# Patient Record
Sex: Male | Born: 1980 | Race: Black or African American | Hispanic: No | Marital: Single | Smoking: Former smoker
Health system: Southern US, Community
[De-identification: ages and names within clinical notes are randomized; demographics above are authoritative.]

## PROBLEM LIST (undated history)

## (undated) DIAGNOSIS — I10 Essential (primary) hypertension: Secondary | ICD-10-CM

---

## 2006-01-12 ENCOUNTER — Emergency Department (HOSPITAL_COMMUNITY): Admission: EM | Admit: 2006-01-12 | Discharge: 2006-01-12 | Payer: Self-pay | Admitting: Emergency Medicine

## 2006-10-17 ENCOUNTER — Ambulatory Visit: Payer: Self-pay | Admitting: Orthopedic Surgery

## 2012-04-24 ENCOUNTER — Encounter (HOSPITAL_COMMUNITY): Payer: Self-pay | Admitting: Emergency Medicine

## 2012-04-24 ENCOUNTER — Emergency Department (HOSPITAL_COMMUNITY)
Admission: EM | Admit: 2012-04-24 | Discharge: 2012-04-24 | Disposition: A | Discharge status: Home / Self Care | Payer: Self-pay | Attending: Emergency Medicine | Admitting: Emergency Medicine

## 2012-04-24 ENCOUNTER — Emergency Department (HOSPITAL_COMMUNITY): Payer: Self-pay

## 2012-04-24 DIAGNOSIS — Y9364 Activity, baseball: Secondary | ICD-10-CM | POA: Insufficient documentation

## 2012-04-24 DIAGNOSIS — S92909A Unspecified fracture of unspecified foot, initial encounter for closed fracture: Secondary | ICD-10-CM | POA: Insufficient documentation

## 2012-04-24 DIAGNOSIS — S92901A Unspecified fracture of right foot, initial encounter for closed fracture: Secondary | ICD-10-CM

## 2012-04-24 DIAGNOSIS — W219XXA Striking against or struck by unspecified sports equipment, initial encounter: Secondary | ICD-10-CM | POA: Insufficient documentation

## 2012-04-24 DIAGNOSIS — Y9239 Other specified sports and athletic area as the place of occurrence of the external cause: Secondary | ICD-10-CM | POA: Insufficient documentation

## 2012-04-24 MED ORDER — HYDROCODONE-ACETAMINOPHEN 5-325 MG PO TABS: 1.0000 | ORAL_TABLET | ORAL | Status: AC | PRN

## 2012-04-24 MED ORDER — IBUPROFEN 600 MG PO TABS: 600.0000 mg | ORAL_TABLET | Freq: Four times a day (QID) | ORAL | Status: AC | PRN

## 2012-04-24 MED ORDER — HYDROCODONE-ACETAMINOPHEN 5-325 MG PO TABS
1.0000 | ORAL_TABLET | Freq: Once | ORAL | Status: AC
  Administered 2012-04-24: 1 via ORAL
  Filled 2012-04-24: qty 1

## 2012-04-24 NOTE — ED Provider Notes (Signed)
History     CSN: [id number]  Arrival date & time 04/24/12  1008   First MD Initiated Contact with Patient 04/24/12 1017      Chief Complaint  Patient presents with  . Ankle Pain    (Consider location/radiation/quality/duration/timing/severity/associated sxs/prior treatment) HPI Comments: Michael Strong presents for evaluation of pain and swelling to his right foot since yesterday. He was running and dove into 2nd base during a ballgame last night, and possibly hyperextended his right foot behind him.  He has used ice and tried elevation with no improvement in pain.  He is able to ambulate but not comfortably. His pain is constant and sharp without radiation.  Rest improves but does not eliminate the pain.  He denies any other injury.  The history is provided by the patient and the spouse.    History reviewed. No pertinent past medical history.  History reviewed. No pertinent past surgical history.  History reviewed. No pertinent family history.  History  Substance Use Topics  . Smoking status: Former Games developer  . Smokeless tobacco: Not on file  . Alcohol Use: No      Review of Systems  Musculoskeletal: Positive for joint swelling and arthralgias.  Skin: Negative for wound.  Neurological: Negative for weakness and numbness.    Allergies  Review of patient's allergies indicates no known allergies.  Home Medications   Current Outpatient Rx  Name Route Sig Dispense Refill  . HYDROCODONE-ACETAMINOPHEN 5-325 MG PO TABS Oral Take 1 tablet by mouth every 4 (four) hours as needed for pain. 20 tablet 0  . IBUPROFEN 600 MG PO TABS Oral Take 1 tablet (600 mg total) by mouth every 6 (six) hours as needed for pain. 20 tablet 0    BP 131/77  Pulse 77  Temp 98.7 F (37.1 C) (Oral)  Ht 5\' 11"  (1.803 m)  Wt 220 lb (99.791 kg)  BMI 30.68 kg/m2  SpO2 97%  Physical Exam  Constitutional: He is oriented to person, place, and time. He appears well-developed and well-nourished.  No distress.  HENT:  Head: Normocephalic and atraumatic.  Eyes: Conjunctivae are normal.  Neck: Neck supple.  Cardiovascular: Normal rate.   Pulses:      Dorsalis pedis pulses are 2+ on the right side, and 2+ on the left side.       Pulses equal bilaterally  Musculoskeletal: Normal range of motion. He exhibits tenderness.       Right foot: He exhibits tenderness and swelling. He exhibits normal capillary refill and no deformity.       Feet:       No right knee pain,  No proximal fibular tenderness.  Neurological: He is alert and oriented to person, place, and time. He has normal strength. No sensory deficit.       Pt has normal sensation in toes of injured foot,  He can flex his toes with discomfort.    Skin: Skin is warm and dry. No erythema.  Psychiatric: He has a normal mood and affect.    ED Course  Procedures (including critical care time)  Labs Reviewed - No data to display Dg Foot Complete Right  04/24/2012  *RADIOLOGY REPORT*  Clinical Data: Pain after twisting injury.  RIGHT FOOT COMPLETE - 3+ VIEW  Comparison: None.  Findings: Avulsion fracture of the dorsal aspect of the tarsal navicular.  Adjacent soft tissue swelling.  IMPRESSION:  Avulsion fracture of the dorsal aspect of the tarsal navicular. Adjacent soft tissue swelling.   Original  Report Authenticated By: Fuller Canada, M.D.      1. Foot fracture, right    Pt given hydrocodone in ed.   MDM  Suspect severe foot sprain with avulsion fracture.  Pt placed in cam walker and he was given crutches,  Advise non weight bearing, ice, elevation, prescribed hydrocodone and ibuprofen.  Referral to Dr. Romeo Apple for a recheck of his injury this week, pt to call for appt.  Pts xrays reviewed with pt.  He and wife understand plan.  Work note given through end of week.        Burgess Amor, [place] 04/24/12 1810

## 2012-04-24 NOTE — ED Notes (Signed)
Pt stated he was hurt playing baseball. Rt ankle foot swelling noted. Pedal pulses present.

## 2012-04-25 NOTE — ED Provider Notes (Signed)
Medical screening examination/treatment/procedure(s) were performed by non-physician practitioner and as supervising physician I was immediately available for consultation/collaboration.   Maleek Craver B. Dakhari Zuver, MD 04/25/12 0700 

## 2012-04-29 ENCOUNTER — Ambulatory Visit: Payer: Self-pay | Admitting: Orthopedic Surgery

## 2012-04-30 ENCOUNTER — Encounter: Payer: Self-pay | Admitting: Orthopedic Surgery

## 2012-04-30 ENCOUNTER — Ambulatory Visit (INDEPENDENT_AMBULATORY_CARE_PROVIDER_SITE_OTHER): Payer: Self-pay | Admitting: Orthopedic Surgery

## 2012-04-30 VITALS — BP 130/90 | Ht 71.0 in | Wt 220.0 lb

## 2012-04-30 DIAGNOSIS — S92251A Displaced fracture of navicular [scaphoid] of right foot, initial encounter for closed fracture: Secondary | ICD-10-CM | POA: Insufficient documentation

## 2012-04-30 DIAGNOSIS — S92253A Displaced fracture of navicular [scaphoid] of unspecified foot, initial encounter for closed fracture: Secondary | ICD-10-CM

## 2012-04-30 NOTE — Progress Notes (Signed)
Patient ID: Michael Strong, male   DOB: Jun 15, 1981, 31 y.o.   MRN: [id number] Chief Complaint  Patient presents with  . Foot Injury    Right foot fracture, DOI 04/23/12   August 20 date of injury secondary to a fall. Complaints throbbing 10 out of 10 intermittent pain worse when he walks better when he sits down associated with bruising tingling and swelling   review of systems negative  Vital signs are stable as recorded  General appearance is normal  The patient is alert and oriented x3  The patient's mood and affect are normal  Gait assessment: Normal in brace The cardiovascular exam reveals normal pulses and temperature without edema swelling.  The lymphatic system is negative for palpable lymph nodes  The sensory exam is normal.  There are no pathologic reflexes.  Balance is normal.   Right foot is tender over the dorsum but mildly so. Range of motion strength stability normal skin intact.  Dorsal avulsion fracture navicular use Cam Walker or heavy boot. Patient is okay to go back to work  Reexamined in 4 weeks

## 2012-04-30 NOTE — Patient Instructions (Addendum)
Wear CAM WALKER x 1 month

## 2012-06-04 ENCOUNTER — Ambulatory Visit: Payer: Self-pay | Admitting: Orthopedic Surgery

## 2012-06-04 ENCOUNTER — Encounter: Payer: Self-pay | Admitting: Orthopedic Surgery

## 2015-02-21 ENCOUNTER — Emergency Department (HOSPITAL_COMMUNITY)
Admission: EM | Admit: 2015-02-21 | Discharge: 2015-02-21 | Disposition: A | Discharge status: Home / Self Care | Payer: Managed Care, Other (non HMO) | Attending: Emergency Medicine | Admitting: Emergency Medicine

## 2015-02-21 ENCOUNTER — Emergency Department (HOSPITAL_COMMUNITY): Payer: Managed Care, Other (non HMO)

## 2015-02-21 ENCOUNTER — Encounter (HOSPITAL_COMMUNITY): Payer: Self-pay | Admitting: *Deleted

## 2015-02-21 DIAGNOSIS — M779 Enthesopathy, unspecified: Secondary | ICD-10-CM | POA: Diagnosis not present

## 2015-02-21 DIAGNOSIS — Z87891 Personal history of nicotine dependence: Secondary | ICD-10-CM | POA: Insufficient documentation

## 2015-02-21 DIAGNOSIS — M778 Other enthesopathies, not elsewhere classified: Secondary | ICD-10-CM

## 2015-02-21 DIAGNOSIS — M79641 Pain in right hand: Secondary | ICD-10-CM | POA: Diagnosis present

## 2015-02-21 MED ORDER — IBUPROFEN 800 MG PO TABS
800.0000 mg | ORAL_TABLET | Freq: Once | ORAL | Status: AC
  Administered 2015-02-21: 800 mg via ORAL
  Filled 2015-02-21: qty 1

## 2015-02-21 MED ORDER — IBUPROFEN 600 MG PO TABS: 600.0000 mg | ORAL_TABLET | Freq: Four times a day (QID) | ORAL | Status: DC | PRN

## 2015-02-21 NOTE — Discharge Instructions (Signed)

## 2015-02-21 NOTE — ED Provider Notes (Signed)
CSN: [id number]     Arrival date & time 02/21/15  2114 History   First MD Initiated Contact with Patient 02/21/15 2128     Chief Complaint  Patient presents with  . Hand Pain     (Consider location/radiation/quality/duration/timing/severity/associated sxs/prior Treatment) The history is provided by the patient.   Michael Strong is a 34 y.o. right handed male with no significant past medical history presenting with right hand pain which started when he pulled a case off a pallet last night at work, describing this as a frequent repetitive movement for his job.  He has had persistent pain along the length of his hand since which is worsened with flexion of his fingers.  He denies weakness or numbness in the fingers or hand and has had no swelling at the site.  He has had no treatments for his symptoms which are better at rest.     History reviewed. No pertinent past medical history. History reviewed. No pertinent past surgical history. No family history on file. History  Substance Use Topics  . Smoking status: Former Games developer  . Smokeless tobacco: Not on file  . Alcohol Use: No    Review of Systems  Constitutional: Negative for fever.  Musculoskeletal: Positive for joint swelling and arthralgias. Negative for myalgias.  Neurological: Negative for weakness and numbness.      Allergies  Review of patient's allergies indicates no known allergies.  Home Medications   Prior to Admission medications   Medication Sig Start Date End Date Taking? Authorizing Provider  ibuprofen (ADVIL,MOTRIN) 600 MG tablet Take 1 tablet (600 mg total) by mouth every 6 (six) hours as needed. 02/21/15   Burgess Amor, PA-C   BP 121/79 mmHg  Pulse 68  Temp(Src) 98.1 F (36.7 C) (Oral)  Resp 20  Ht 5\' 11"  (1.803 m)  Wt 200 lb (90.719 kg)  BMI 27.91 kg/m2  SpO2 97% Physical Exam  Constitutional: He appears well-developed and well-nourished.  HENT:  Head: Atraumatic.  Neck: Normal range of  motion.  Cardiovascular:  Pulses equal bilaterally  Musculoskeletal: He exhibits tenderness.  ttp along the length of his right 4th volar flexor tendon.  No palpable deformity, no nodules.  Pt displays FROM with flex/ext of all digits, but with increased pain of the 4th.  Less than 2 sec fingertip cap refill. Sensation normal.  Neurological: He is alert. He has normal strength. He displays normal reflexes. No sensory deficit.  Skin: Skin is warm and dry.  Psychiatric: He has a normal mood and affect.    ED Course  Procedures (including critical care time) Labs Review Labs Reviewed - No data to display  Imaging Review Dg Hand Complete Right  02/21/2015   CLINICAL DATA:  RIGHT HAND PAIN, Pt states that he was at work at last, went to pull a case off a pallet and felt a pain in the middle of his right hand, pain has continued through the day, pain is increased with movement and use of right hand,  EXAM: RIGHT HAND - COMPLETE 3+ VIEW  COMPARISON:  None.  FINDINGS: There is no evidence of fracture or dislocation. There is no evidence of arthropathy or other focal bone abnormality. Soft tissues are unremarkable.  IMPRESSION: Negative.   Electronically Signed   By: Annia Belt M.D.   On: 02/21/2015 22:43     EKG Interpretation None      MDM   Final diagnoses:  Tendonitis of right hand    Patients labs and/or  radiological studies were reviewed and considered during the medical decision making and disposition process.  Results were also discussed with patient. Pt placed in finger splint in slight flexion, advised ice, rest, ibuprofen, f/u with ortho if sx persist beyond the next week, referrals given.  The patient appears reasonably screened and/or stabilized for discharge and I doubt any other medical condition or other [facility] requiring further screening, evaluation, or treatment in the ED at this time prior to discharge.     Burgess Amor, PA-C 02/21/15 2311  Mancel Bale, MD 02/22/15  [phone]

## 2015-02-21 NOTE — ED Notes (Signed)
Pt states that he was at work at last, went to pull a case off a pallet and felt a pain in the middle of his right hand, pain has continued through the day, pain is increased with movement and use of right hand,

## 2015-07-01 ENCOUNTER — Emergency Department (HOSPITAL_COMMUNITY)
Admission: EM | Admit: 2015-07-01 | Discharge: 2015-07-01 | Disposition: A | Discharge status: Home / Self Care | Payer: Managed Care, Other (non HMO) | Attending: Emergency Medicine | Admitting: Emergency Medicine

## 2015-07-01 ENCOUNTER — Encounter (HOSPITAL_COMMUNITY): Payer: Self-pay

## 2015-07-01 DIAGNOSIS — M436 Torticollis: Secondary | ICD-10-CM | POA: Diagnosis not present

## 2015-07-01 DIAGNOSIS — M542 Cervicalgia: Secondary | ICD-10-CM | POA: Diagnosis present

## 2015-07-01 DIAGNOSIS — Z87891 Personal history of nicotine dependence: Secondary | ICD-10-CM | POA: Insufficient documentation

## 2015-07-01 MED ORDER — CYCLOBENZAPRINE HCL 10 MG PO TABS: 10.0000 mg | ORAL_TABLET | Freq: Three times a day (TID) | ORAL | Status: DC | PRN

## 2015-07-01 MED ORDER — ACETAMINOPHEN 500 MG PO TABS
1000.0000 mg | ORAL_TABLET | Freq: Once | ORAL | Status: AC
  Administered 2015-07-01: 1000 mg via ORAL
  Filled 2015-07-01: qty 2

## 2015-07-01 MED ORDER — KETOROLAC TROMETHAMINE 60 MG/2ML IM SOLN
60.0000 mg | Freq: Once | INTRAMUSCULAR | Status: AC
  Administered 2015-07-01: 60 mg via INTRAMUSCULAR
  Filled 2015-07-01: qty 2

## 2015-07-01 MED ORDER — IBUPROFEN 600 MG PO TABS: 600.0000 mg | ORAL_TABLET | Freq: Three times a day (TID) | ORAL | Status: DC | PRN

## 2015-07-01 MED ORDER — CYCLOBENZAPRINE HCL 10 MG PO TABS
10.0000 mg | ORAL_TABLET | Freq: Once | ORAL | Status: AC
  Administered 2015-07-01: 10 mg via ORAL
  Filled 2015-07-01: qty 1

## 2015-07-01 NOTE — ED Provider Notes (Signed)
CSN: [id number]     Arrival date & time 07/01/15  0245 History   First MD Initiated Contact with Patient 07/01/15 0252     Chief Complaint  Patient presents with  . Torticollis       HPI Patient awoke this evening with severe pain in his right lateral neck.  He states his pain is worse with movement of his head towards the right.  He keeps his head positioned to the left at this time.  No fevers or chills.  No difficulty breathing or swallowing.  No mouth pain.  No anterior neck pain.  Reports no injury or trauma.  Has not tried any medication prior to arrival.  No other complaints at this time.   History reviewed. No pertinent past medical history. History reviewed. No pertinent past surgical history. No family history on file. Social History  Substance Use Topics  . Smoking status: Former Games developermoker  . Smokeless tobacco: None  . Alcohol Use: No    Review of Systems  All other systems reviewed and are negative.     Allergies  Review of patient's allergies indicates no known allergies.  Home Medications   Prior to Admission medications   Medication Sig Start Date End Date Taking? Authorizing Provider  cyclobenzaprine (FLEXERIL) 10 MG tablet Take 1 tablet (10 mg total) by mouth 3 (three) times daily as needed for muscle spasms. 07/01/15   Azalia BilisKevin Donne Robillard, MD  ibuprofen (ADVIL,MOTRIN) 600 MG tablet Take 1 tablet (600 mg total) by mouth every 8 (eight) hours as needed. 07/01/15   Azalia BilisKevin Charlisha Market, MD   BP 121/79 mmHg  Pulse 77  Temp(Src) 98.3 F (36.8 C) (Oral)  Resp 16  Ht 5\' 10"  (1.778 m)  Wt 220 lb (99.791 kg)  BMI 31.57 kg/m2  SpO2 98% Physical Exam  Constitutional: He is oriented to person, place, and time. He appears well-developed and well-nourished.  HENT:  Head: Normocephalic and atraumatic.  Eyes: EOM are normal.  Neck: Neck supple. No tracheal deviation present. No thyromegaly present.  Mild spasm noted of the right paracervical muscles  Pulmonary/Chest: Effort  normal. No stridor.  Abdominal: He exhibits no distension.  Musculoskeletal: Normal range of motion.  Lymphadenopathy:    He has no cervical adenopathy.  Neurological: He is alert and oriented to person, place, and time.  Psychiatric: He has a normal mood and affect.  Nursing note and vitals reviewed.   ED Course  Procedures (including critical care time) Labs Review Labs Reviewed - No data to display  Imaging Review No results found. I have personally reviewed and evaluated these images and lab results as part of my medical decision-making.   EKG Interpretation None      MDM   Final diagnoses:  Torticollis    Paracervical spasm/torticollis.  Patient treated with anti-inflammatories and muscle relaxants.  Heat applied.  Some improvement while in the emergency department.  Home with muscle relaxants.    Azalia BilisKevin Jonnie Truxillo, MD 07/01/15 [phone]

## 2015-07-01 NOTE — ED Notes (Signed)
Pt awoke Wednesday am with stiffness to his neck and diff turning his head.  Pt denies injury or trauma

## 2015-07-01 NOTE — ED Notes (Signed)
   07/01/15 0326  Musculoskeletal  Musculoskeletal (WDL) X  Range of Motion Other (Comment)  Pt c/o neck pain with minimal movement of the neck without pain. Pt states he woke up like this and denies any injury.

## 2015-07-01 NOTE — Discharge Instructions (Signed)
Acute Torticollis °Torticollis is a condition in which the muscles of the neck tighten (contract) abnormally, causing the neck to twist and the head to move into an unnatural position. Torticollis that develops suddenly is called acute torticollis. If torticollis becomes chronic and is left untreated, the face and neck can become deformed. °CAUSES °This condition may be caused by: °· Sleeping in an awkward position (common). °· Extending or twisting the neck muscles beyond their normal position. °· Infection. °In some cases, the cause may not be known. °SYMPTOMS °Symptoms of this condition include: °· An unnatural position of the head. °· Neck pain. °· A limited ability to move the neck. °· Twisting of the neck to one side. °DIAGNOSIS °This condition is diagnosed with a physical exam. You may also have imaging tests, such as an X-ray, CT scan, or MRI. °TREATMENT °Treatment for this condition involves trying to relax the neck muscles. It may include: °· Medicines or shots. °· Physical therapy. °· Surgery. This may be done in severe cases. °HOME CARE INSTRUCTIONS °· Take medicines only as directed by your health care provider. °· Do stretching exercises and massage your neck as directed by your health care provider. °· Keep all follow-up visits as directed by your health care provider. This is important. °SEEK MEDICAL CARE IF: °· You develop a fever. °SEEK IMMEDIATE MEDICAL CARE IF: °· You develop difficulty breathing. °· You develop noisy breathing (stridor). °· You start drooling. °· You have trouble swallowing or have pain with swallowing. °· You develop numbness or weakness in your hands or feet. °· You have changes in your speech, understanding, or vision. °· Your pain gets worse. °  °This information is not intended to replace advice given to you by your health care provider. Make sure you discuss any questions you have with your health care provider. °  °Document Released: 08/18/2000 Document Revised:  01/05/2015 Document Reviewed: 08/17/2014 °Elsevier Interactive Patient Education ©2016 [employer] ° °

## 2015-10-15 ENCOUNTER — Encounter (HOSPITAL_COMMUNITY): Payer: Self-pay | Admitting: Emergency Medicine

## 2015-10-15 ENCOUNTER — Emergency Department (HOSPITAL_COMMUNITY)
Admission: EM | Admit: 2015-10-15 | Discharge: 2015-10-15 | Disposition: A | Discharge status: Home / Self Care | Payer: Managed Care, Other (non HMO) | Attending: Emergency Medicine | Admitting: Emergency Medicine

## 2015-10-15 ENCOUNTER — Emergency Department (HOSPITAL_COMMUNITY): Payer: Managed Care, Other (non HMO)

## 2015-10-15 DIAGNOSIS — K5900 Constipation, unspecified: Secondary | ICD-10-CM | POA: Insufficient documentation

## 2015-10-15 DIAGNOSIS — K589 Irritable bowel syndrome without diarrhea: Secondary | ICD-10-CM | POA: Insufficient documentation

## 2015-10-15 DIAGNOSIS — Z87891 Personal history of nicotine dependence: Secondary | ICD-10-CM | POA: Insufficient documentation

## 2015-10-15 LAB — CBC WITH DIFFERENTIAL/PLATELET
Basophils Absolute: 0 10*3/uL (ref 0.0–0.1)
Basophils Relative: 0 %
EOS ABS: 0.1 10*3/uL (ref 0.0–0.7)
Eosinophils Relative: 2 %
HEMATOCRIT: 43.3 % (ref 39.0–52.0)
HEMOGLOBIN: 14.7 g/dL (ref 13.0–17.0)
LYMPHS ABS: 1.7 10*3/uL (ref 0.7–4.0)
LYMPHS PCT: 35 %
MCH: 31 pg (ref 26.0–34.0)
MCHC: 33.9 g/dL (ref 30.0–36.0)
MCV: 91.4 fL (ref 78.0–100.0)
MONOS PCT: 12 %
Monocytes Absolute: 0.6 10*3/uL (ref 0.1–1.0)
NEUTROS ABS: 2.5 10*3/uL (ref 1.7–7.7)
NEUTROS PCT: 51 %
PLATELETS: 205 10*3/uL (ref 150–400)
RBC: 4.74 MIL/uL (ref 4.22–5.81)
RDW: 13.4 % (ref 11.5–15.5)
WBC: 4.8 10*3/uL (ref 4.0–10.5)

## 2015-10-15 LAB — BASIC METABOLIC PANEL
ANION GAP: 8 (ref 5–15)
BUN: 17 mg/dL (ref 6–20)
CHLORIDE: 102 mmol/L (ref 101–111)
CO2: 29 mmol/L (ref 22–32)
Calcium: 9.4 mg/dL (ref 8.9–10.3)
Creatinine, Ser: 1.14 mg/dL (ref 0.61–1.24)
Glucose, Bld: 114 mg/dL — ABNORMAL HIGH (ref 65–99)
POTASSIUM: 3.9 mmol/L (ref 3.5–5.1)
SODIUM: 139 mmol/L (ref 135–145)

## 2015-10-15 LAB — HEPATIC FUNCTION PANEL
ALBUMIN: 4.5 g/dL (ref 3.5–5.0)
ALK PHOS: 67 U/L (ref 38–126)
ALT: 34 U/L (ref 17–63)
AST: 28 U/L (ref 15–41)
BILIRUBIN INDIRECT: 0.4 mg/dL (ref 0.3–0.9)
Bilirubin, Direct: 0.1 mg/dL (ref 0.1–0.5)
TOTAL PROTEIN: 7.7 g/dL (ref 6.5–8.1)
Total Bilirubin: 0.5 mg/dL (ref 0.3–1.2)

## 2015-10-15 LAB — URINALYSIS, ROUTINE W REFLEX MICROSCOPIC
BILIRUBIN URINE: NEGATIVE
Glucose, UA: NEGATIVE mg/dL
Ketones, ur: NEGATIVE mg/dL
LEUKOCYTES UA: NEGATIVE
NITRITE: NEGATIVE
PH: 6.5 (ref 5.0–8.0)
Protein, ur: NEGATIVE mg/dL
SPECIFIC GRAVITY, URINE: 1.015 (ref 1.005–1.030)

## 2015-10-15 LAB — URINE MICROSCOPIC-ADD ON

## 2015-10-15 LAB — LIPASE, BLOOD: Lipase: 33 U/L (ref 11–51)

## 2015-10-15 MED ORDER — DICYCLOMINE HCL 20 MG PO TABS: 20.0000 mg | ORAL_TABLET | Freq: Two times a day (BID) | ORAL | Status: DC | PRN

## 2015-10-15 MED ORDER — POLYETHYLENE GLYCOL 3350 17 G PO PACK: 17.0000 g | PACK | Freq: Every day | ORAL | Status: DC

## 2015-10-15 NOTE — ED Notes (Signed)
Patient complaining of lower abdominal pain x 1 week. Denies dysuria. Denies vomiting / diarrhea.

## 2015-10-15 NOTE — Discharge Instructions (Signed)

## 2015-10-15 NOTE — ED Provider Notes (Signed)
CSN: [id number]     Arrival date & time 10/15/15  1415 History   First MD Initiated Contact with Patient 10/15/15 1645     Chief Complaint  Patient presents with  . Abdominal Pain     (Consider location/radiation/quality/duration/timing/severity/associated sxs/prior Treatment) HPI Patient presents with one week of intermittent right lower quadrant pain. Describes the pain as a sharp pain. Last roughly 1 seconds and then completely resolves. No known exacerbating or alleviating factors. Denies any constipation, vomiting, diarrhea, urinary changes. No previously similar symptoms. No previous abdominal surgeries. No fever or chills. Denies radiation of pain. Currently the patient is pain-free. History reviewed. No pertinent past medical history. History reviewed. No pertinent past surgical history. History reviewed. No pertinent family history. Social History  Substance Use Topics  . Smoking status: Former Games developer  . Smokeless tobacco: None  . Alcohol Use: No    Review of Systems  Constitutional: Negative for fever and chills.  Respiratory: Negative for shortness of breath.   Cardiovascular: Negative for chest pain.  Gastrointestinal: Positive for abdominal pain. Negative for nausea, vomiting, diarrhea, constipation and abdominal distention.  Genitourinary: Negative for dysuria, frequency, hematuria, flank pain and testicular pain.  Musculoskeletal: Negative for myalgias and back pain.  Skin: Negative for rash and wound.  Neurological: Negative for dizziness, weakness, numbness and headaches.  All other systems reviewed and are negative.     Allergies  Review of patient's allergies indicates no known allergies.  Home Medications   Prior to Admission medications   Medication Sig Start Date End Date Taking? Authorizing Provider  cyclobenzaprine (FLEXERIL) 10 MG tablet Take 1 tablet (10 mg total) by mouth 3 (three) times daily as needed for muscle spasms. 07/01/15   Azalia Bilis,  MD  dicyclomine (BENTYL) 20 MG tablet Take 1 tablet (20 mg total) by mouth 2 (two) times daily as needed for spasms. 10/15/15   Loren Racer, MD  ibuprofen (ADVIL,MOTRIN) 600 MG tablet Take 1 tablet (600 mg total) by mouth every 8 (eight) hours as needed. 07/01/15   Azalia Bilis, MD  polyethylene glycol [facility] / GLYCOLAX) packet Take 17 g by mouth daily. 10/15/15   Loren Racer, MD   BP 147/78 mmHg  Pulse 74  Temp(Src) 97.9 F (36.6 C) (Oral)  Resp 16  Ht  (1.803 m)  Wt 215 lb (97.523 kg)  BMI 30.00 kg/m2  SpO2 100% Physical Exam  Constitutional: He is oriented to person, place, and time. He appears well-developed and well-nourished. No distress.  HENT:  Head: Normocephalic and atraumatic.  Mouth/Throat: Oropharynx is clear and moist.  Eyes: EOM are normal. Pupils are equal, round, and reactive to light.  Neck: Normal range of motion. Neck supple.  Cardiovascular: Normal rate and regular rhythm.   Pulmonary/Chest: Effort normal and breath sounds normal. No respiratory distress. He has no wheezes. He has no rales.  Abdominal: Soft. Bowel sounds are normal. He exhibits distension. He exhibits no mass. There is no tenderness. There is no rebound and no guarding.  Abdomen is mildly distended  Musculoskeletal: Normal range of motion. He exhibits no edema or tenderness.  No CVA tenderness bilaterally.  Neurological: He is alert and oriented to person, place, and time.  Skin: Skin is warm and dry. No rash noted. No erythema.  Psychiatric: He has a normal mood and affect. His behavior is normal.  Nursing note and vitals reviewed.   ED Course  Procedures (including critical care time) Labs Review Labs Reviewed  URINALYSIS, ROUTINE W REFLEX MICROSCOPIC (  NOT AT [facility]) - Abnormal; Notable for the following:    Hgb urine dipstick TRACE (*)    All other components within normal limits  BASIC METABOLIC PANEL - Abnormal; Notable for the following:    Glucose, Bld 114 (*)    All  other components within normal limits  URINE MICROSCOPIC-ADD ON - Abnormal; Notable for the following:    Squamous Epithelial / LPF 0-5 (*)    Bacteria, UA RARE (*)    All other components within normal limits  CBC WITH DIFFERENTIAL/PLATELET  HEPATIC FUNCTION PANEL  LIPASE, BLOOD    Imaging Review Dg Abd 1 View  10/15/2015  CLINICAL DATA:  Lower abdominal pain for 1 week EXAM: ABDOMEN - 1 VIEW COMPARISON:  CT abdomen and pelvis October 24, 2014 FINDINGS: There is moderate stool throughout the colon. There is no bowel dilatation or air-fluid level suggesting obstruction. No free air. There are no abnormal calcifications. IMPRESSION: No demonstrable obstruction or free air on this supine examination. Electronically Signed   By: Bretta Bang III M.D.   On: 10/15/2015 17:43   I have personally reviewed and evaluated these images and lab results as part of my medical decision-making.   EKG Interpretation None      MDM   Final diagnoses:  Constipation, unspecified constipation type  Spasm of bowel    Patient has a normal abdominal exam. He has no tenderness with palpation. X-ray with moderate stool throughout. Likely bowel spasms due to constipation. We'll treat constipation and spasms. Patient is advised to return immediately for worsening pain, fever or for any concerns.    Loren Racer, MD 10/15/15 [place]

## 2016-01-23 ENCOUNTER — Emergency Department (HOSPITAL_COMMUNITY): Payer: Self-pay

## 2016-01-23 ENCOUNTER — Emergency Department (HOSPITAL_COMMUNITY)
Admission: EM | Admit: 2016-01-23 | Discharge: 2016-01-23 | Disposition: A | Discharge status: Home / Self Care | Payer: Self-pay | Attending: Emergency Medicine | Admitting: Emergency Medicine

## 2016-01-23 ENCOUNTER — Encounter (HOSPITAL_COMMUNITY): Payer: Self-pay

## 2016-01-23 DIAGNOSIS — Z87891 Personal history of nicotine dependence: Secondary | ICD-10-CM | POA: Insufficient documentation

## 2016-01-23 DIAGNOSIS — B9789 Other viral agents as the cause of diseases classified elsewhere: Secondary | ICD-10-CM

## 2016-01-23 DIAGNOSIS — Z791 Long term (current) use of non-steroidal anti-inflammatories (NSAID): Secondary | ICD-10-CM | POA: Insufficient documentation

## 2016-01-23 DIAGNOSIS — J069 Acute upper respiratory infection, unspecified: Secondary | ICD-10-CM | POA: Insufficient documentation

## 2016-01-23 DIAGNOSIS — R51 Headache: Secondary | ICD-10-CM | POA: Insufficient documentation

## 2016-01-23 MED ORDER — BENZONATATE 100 MG PO CAPS
200.0000 mg | ORAL_CAPSULE | Freq: Once | ORAL | Status: AC
  Administered 2016-01-23: 200 mg via ORAL
  Filled 2016-01-23: qty 2

## 2016-01-23 MED ORDER — BENZONATATE 100 MG PO CAPS: 200.0000 mg | ORAL_CAPSULE | Freq: Three times a day (TID) | ORAL | Status: DC | PRN

## 2016-01-23 MED ORDER — PROMETHAZINE-CODEINE 6.25-10 MG/5ML PO SYRP: 5.0000 mL | ORAL_SOLUTION | ORAL | Status: DC | PRN

## 2016-01-23 NOTE — ED Provider Notes (Signed)
CSN: [id number]     Arrival date & time 01/23/16  1549 History  By signing my name below, I, Linna Darner, attest that this documentation has been prepared under the direction and in the presence of non-physician practitioner, Burgess Amor, PA-C. Electronically Signed: Linna Darner, Scribe. 01/23/2016. 4:16 PM.   Chief Complaint  Patient presents with  . Sore Throat    The history is provided by the patient. No language interpreter was used.     HPI Comments: Michael Strong is a 35 y.o. male who presents to the Emergency Department complaining of sudden onset, constant, sore throat for the last two days. Pt also endorses associated dry cough and fever; he has not taken his temperature but states that he has felt hot since onset. Pt reports that he initially experienced a cough before any of his other symptoms; he endorses difficulty sleeping due to his cough which worsens at night. He also notes a headache  yesterday which have resolved. Pt has not tried any medications for his symptoms. He reports that he has been eating and drinking normally; he last ate potato chips a couple of hours ago. Pt has no h/o asthma. He is not a smoker. Pt is not allergic to any medications to his knowledge. He lives with his wife at home and has no sick contacts to his knowledge. 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 Pt denies recent long travel. He further denies chest pain, SOB, ear pain, nasal congestion, rhinorrhea, trouble swallowing, nausea, or any other associated symptoms. His PCP is Dr. Neita Carp.  History reviewed. No pertinent past medical history. History reviewed. No pertinent past surgical history. History reviewed. No pertinent family history. Social History  Substance Use Topics  . Smoking status: Former Games developer  . Smokeless tobacco: None  . Alcohol Use: No    Review of Systems  Constitutional: Positive for fever. Negative for appetite change.  HENT:  Positive for sore throat. Negative for congestion, ear pain, rhinorrhea and trouble swallowing.   Respiratory: Positive for cough (dry). Negative for shortness of breath.   Cardiovascular: Negative for chest pain.  Gastrointestinal: Negative for nausea and abdominal pain.  Neurological: Positive for headaches.   Allergies  Review of patient's allergies indicates no known allergies.  Home Medications   Prior to Admission medications   Medication Sig Start Date End Date Taking? Authorizing Provider  benzonatate (TESSALON) 100 MG capsule Take 2 capsules (200 mg total) by mouth 3 (three) times daily as needed. 01/23/16   Burgess Amor, PA-C  cyclobenzaprine (FLEXERIL) 10 MG tablet Take 1 tablet (10 mg total) by mouth 3 (three) times daily as needed for muscle spasms. 07/01/15   Azalia Bilis, MD  dicyclomine (BENTYL) 20 MG tablet Take 1 tablet (20 mg total) by mouth 2 (two) times daily as needed for spasms. 10/15/15   Loren Racer, MD  ibuprofen (ADVIL,MOTRIN) 600 MG tablet Take 1 tablet (600 mg total) by mouth every 8 (eight) hours as needed. 07/01/15   Azalia Bilis, MD  polyethylene glycol [facility] / GLYCOLAX) packet Take 17 g by mouth daily. 10/15/15   Loren Racer, MD  promethazine-codeine (PHENERGAN WITH CODEINE) 6.25-10 MG/5ML syrup Take 5 mLs by mouth every 4 (four) hours as needed for cough. 01/23/16   Burgess Amor, PA-C   BP 123/75 mmHg  Pulse 86  Temp(Src) 98.2 F (36.8 C) (Oral)  Resp 18  Ht  (1.803 m)  Wt 97.523 kg  BMI 30.00  kg/m2  SpO2 98% Physical Exam  Constitutional: He is oriented to person, place, and time. He appears well-developed and well-nourished. No distress.  HENT:  Head: Normocephalic and atraumatic.  Right Ear: Tympanic membrane and ear canal normal.  Left Ear: Tympanic membrane and ear canal normal.  Nose: Nose normal.  Mouth/Throat: Uvula is midline and oropharynx is clear and moist.  Eyes: Conjunctivae and EOM are normal.  Neck: Neck supple. No  tracheal deviation present.  Cardiovascular: Normal rate.   Pulmonary/Chest: Effort normal. No respiratory distress. He has no wheezes. He has no rhonchi.  Breath sounds are distant.  Musculoskeletal: Normal range of motion.  Neurological: He is alert and oriented to person, place, and time.  Skin: Skin is warm and dry.  Psychiatric: He has a normal mood and affect. His behavior is normal.  Nursing note and vitals reviewed.   ED Course  Procedures (including critical care time)  DIAGNOSTIC STUDIES: Oxygen Saturation is 98% on RA, normal by my interpretation.    COORDINATION OF CARE: 4:16 PM Discussed treatment plan with pt at bedside and pt agreed to plan.  Labs Review Labs Reviewed - No data to display  Imaging Review Dg Chest 2 View  01/23/2016  CLINICAL DATA:  35 year old male with sore throat and nonproductive cough for the past 2 days EXAM: CHEST  2 VIEW COMPARISON:  None. FINDINGS: The lungs are clear and negative for focal airspace consolidation, pulmonary edema or suspicious pulmonary nodule. No pleural effusion or pneumothorax. Cardiac and mediastinal contours are within normal limits. No acute fracture or lytic or blastic osseous lesions. The visualized upper abdominal bowel gas pattern is unremarkable. IMPRESSION: No active cardiopulmonary disease. Electronically Signed   By: Malachy MoanHeath  McCullough M.D.   On: 01/23/2016 16:53   I have personally reviewed and evaluated these images and lab results as part of my medical decision-making.   EKG Interpretation None      MDM   Final diagnoses:  Viral URI with cough     Radiological studies were viewed, interpreted and considered during the medical decision making and disposition process. I agree with radiologists reading.  Results were also discussed with patient. Pt prescribed tessalon for daytime cough use, phenergan/codeine syrup if needed for additional nighttime relief.  Rest, increased fluid intake, f/u with pcp if sx  persist or worsen.  I personally performed the services described in this documentation, which was scribed in my presence. The recorded information has been reviewed and is accurate.   Burgess AmorJulie Salene Mohamud, PA-C 01/23/16 1717  Donnetta HutchingBrian Cook, MD 01/23/16 [phone]

## 2016-01-23 NOTE — ED Notes (Signed)
Patient verbalizes understanding of discharge instructions, prescriptions, home care and follow up care. Patient out of department at this time. 

## 2016-01-23 NOTE — ED Notes (Signed)
Sore throat X 2 days.

## 2016-01-23 NOTE — ED Notes (Signed)
Pt with sore throat for 2 days, NP cough, fever the other day per pt.  Pt denies taking anything for symptoms

## 2016-01-23 NOTE — Discharge Instructions (Signed)
Upper Respiratory Infection, Adult Most upper respiratory infections (URIs) are a viral infection of the air passages leading to the lungs. A URI affects the nose, throat, and upper air passages. The most common type of URI is nasopharyngitis and is typically referred to as "the common cold." URIs run their course and usually go away on their own. Most of the time, a URI does not require medical attention, but sometimes a bacterial infection in the upper airways can follow a viral infection. This is called a secondary infection. Sinus and middle ear infections are common types of secondary upper respiratory infections. Bacterial pneumonia can also complicate a URI. A URI can worsen asthma and chronic obstructive pulmonary disease (COPD). Sometimes, these complications can require emergency medical care and may be life threatening.  CAUSES Almost all URIs are caused by viruses. A virus is a type of germ and can spread from one person to another.  RISKS FACTORS You may be at risk for a URI if:   You smoke.   You have chronic heart or lung disease.  You have a weakened defense (immune) system.   You are very young or very old.   You have nasal allergies or asthma.  You work in crowded or poorly ventilated areas.  You work in health care facilities or schools. SIGNS AND SYMPTOMS  Symptoms typically develop 2-3 days after you come in contact with a cold virus. Most viral URIs last 7-10 days. However, viral URIs from the influenza virus (flu virus) can last 14-18 days and are typically more severe. Symptoms may include:   Runny or stuffy (congested) nose.   Sneezing.   Cough.   Sore throat.   Headache.   Fatigue.   Fever.   Loss of appetite.   Pain in your forehead, behind your eyes, and over your cheekbones (sinus pain).  Muscle aches.  DIAGNOSIS  Your health care provider may diagnose a URI by:  Physical exam.  Tests to check that your symptoms are not due to  another condition such as:  Strep throat.  Sinusitis.  Pneumonia.  Asthma. TREATMENT  A URI goes away on its own with time. It cannot be cured with medicines, but medicines may be prescribed or recommended to relieve symptoms. Medicines may help:  Reduce your fever.  Reduce your cough.  Relieve nasal congestion. HOME CARE INSTRUCTIONS   Take medicines only as directed by your health care provider.   Gargle warm saltwater or take cough drops to comfort your throat as directed by your health care provider.  Use a warm mist humidifier or inhale steam from a shower to increase air moisture. This may make it easier to breathe.  Drink enough fluid to keep your urine clear or pale yellow.   Eat soups and other clear broths and maintain good nutrition.   Rest as needed.   Return to work when your temperature has returned to normal or as your health care provider advises. You may need to stay home longer to avoid infecting others. You can also use a face mask and careful hand washing to prevent spread of the virus.  Increase the usage of your inhaler if you have asthma.   Do not use any tobacco products, including cigarettes, chewing tobacco, or electronic cigarettes. If you need help quitting, ask your health care provider. PREVENTION  The best way to protect yourself from getting a cold is to practice good hygiene.   Avoid oral or hand contact with people with cold  symptoms.   Wash your hands often if contact occurs.  There is no clear evidence that vitamin C, vitamin E, echinacea, or exercise reduces the chance of developing a cold. However, it is always recommended to get plenty of rest, exercise, and practice good nutrition.  SEEK MEDICAL CARE IF:   You are getting worse rather than better.   Your symptoms are not controlled by medicine.   You have chills.  You have worsening shortness of breath.  You have brown or red mucus.  You have yellow or brown nasal  discharge.  You have pain in your face, especially when you bend forward.  You have a fever.  You have swollen neck glands.  You have pain while swallowing.  You have white areas in the back of your throat. SEEK IMMEDIATE MEDICAL CARE IF:   You have severe or persistent:  Headache.  Ear pain.  Sinus pain.  Chest pain.  You have chronic lung disease and any of the following:  Wheezing.  Prolonged cough.  Coughing up blood.  A change in your usual mucus.  You have a stiff neck.  You have changes in your:  Vision.  Hearing.  Thinking.  Mood. MAKE SURE YOU:   Understand these instructions.  Will watch your condition.  Will get help right away if you are not doing well or get worse.   This information is not intended to replace advice given to you by your health care provider. Make sure you discuss any questions you have with your health care provider.   Document Released: 02/14/2001 Document Revised: 01/05/2015 Document Reviewed: 11/26/2013 Elsevier Interactive Patient Education 2016 [employer]   Rest and make sure you are drinking plenty of fluids.  You may take the phenergan with codeine cough syrup, but is better to take at night as it will make sleepy.  Do not drive within 4 hours of taking this medication.

## 2016-03-02 ENCOUNTER — Emergency Department (HOSPITAL_COMMUNITY)
Admission: EM | Admit: 2016-03-02 | Discharge: 2016-03-02 | Disposition: A | Discharge status: Home / Self Care | Payer: Self-pay | Attending: Emergency Medicine | Admitting: Emergency Medicine

## 2016-03-02 ENCOUNTER — Encounter (HOSPITAL_COMMUNITY): Payer: Self-pay

## 2016-03-02 ENCOUNTER — Emergency Department (HOSPITAL_COMMUNITY): Payer: Self-pay

## 2016-03-02 DIAGNOSIS — W010XXA Fall on same level from slipping, tripping and stumbling without subsequent striking against object, initial encounter: Secondary | ICD-10-CM | POA: Insufficient documentation

## 2016-03-02 DIAGNOSIS — Q899 Congenital malformation, unspecified: Secondary | ICD-10-CM

## 2016-03-02 DIAGNOSIS — S63282A Dislocation of proximal interphalangeal joint of right middle finger, initial encounter: Secondary | ICD-10-CM | POA: Insufficient documentation

## 2016-03-02 DIAGNOSIS — Y929 Unspecified place or not applicable: Secondary | ICD-10-CM | POA: Insufficient documentation

## 2016-03-02 DIAGNOSIS — Z87891 Personal history of nicotine dependence: Secondary | ICD-10-CM | POA: Insufficient documentation

## 2016-03-02 DIAGNOSIS — Y999 Unspecified external cause status: Secondary | ICD-10-CM | POA: Insufficient documentation

## 2016-03-02 DIAGNOSIS — Y9367 Activity, basketball: Secondary | ICD-10-CM | POA: Insufficient documentation

## 2016-03-02 MED ORDER — TRAMADOL HCL 50 MG PO TABS: 100.0000 mg | ORAL_TABLET | Freq: Four times a day (QID) | ORAL | Status: DC | PRN

## 2016-03-02 MED ORDER — NAPROXEN 500 MG PO TABS: ORAL_TABLET | ORAL | Status: DC

## 2016-03-02 MED ORDER — BUPIVACAINE HCL (PF) 0.5 % IJ SOLN
10.0000 mL | Freq: Once | INTRAMUSCULAR | Status: AC
  Administered 2016-03-02: 10 mL
  Filled 2016-03-02: qty 30

## 2016-03-02 NOTE — Discharge Instructions (Signed)
Elevate your finger. Use ice packs if needed for swelling or comfort. You need to wear the finger splint for at least a week. Take the naproxen and tramadol for pain if needed. You can take acetaminophen 1000 mg with the tramadol. Please call Dr. Mort SawyersHarrison's office, the orthopedist in [place], to have him recheck your finger in a week and make sure you can come out of the splint.   Finger Dislocation Finger dislocation is the displacement of bones in your finger at the joints. Most commonly, finger dislocation occurs at the proximal interphalangeal joint (the joint closest to your knuckle). Very strong, fibrous tissues (ligaments) and joint capsules connect the three bones of your fingers.  CAUSES Dislocation is caused by a forceful impact. This impact moves these bones off the joint and often tears your ligaments.  SYMPTOMS Symptoms of finger dislocation include:  Deformity of your finger.  Pain, with loss of movement. DIAGNOSIS  Finger dislocation is diagnosed with a physical exam. Often, X-ray exams are done to see if you have associated injuries, such as bone fractures. TREATMENT  Finger dislocations are treated by putting your bones back into position (reduction) either by manually moving the bones back into place or through surgery. Your finger is then kept in a fixed position (immobilized) with the use of a dressing or splint for a brief period. When your ligament has to be surgically repaired, it needs to be kept in a fixed position with a dressing or splint for 1 to 2 weeks. Because joint stiffness is a long-term complication of finger dislocation, hand exercises or physical therapy to increase the range of motion and to regain strength is usually started as soon as the ligament is healed. Exercises and therapy generally last no more than 3 months. HOME CARE INSTRUCTIONS The following measures can help to reduce pain and speed up the healing process:  Rest your injured joint. Do not  move until instructed otherwise by your caregiver. Avoid activities similar to the one that caused your injury.  Apply ice to your injured joint for the first day or 2 after your reduction or as directed by your caregiver. Applying ice helps to reduce inflammation and pain.  Put ice in a plastic bag.  Place a towel between your skin and the bag.  Leave the ice on for 15-20 minutes at a time, every 2 hours while you are awake.  Elevate your hand above your heart as directed by your caregiver to reduce swelling.  Take over-the-counter or prescription medicine for pain as your caregiver instructs you. SEEK IMMEDIATE MEDICAL CARE IF:  Your dressing or splint becomes damaged.  Your pain becomes worse rather than better.  You lose feeling in your finger, or it becomes cold and white. MAKE SURE YOU:  Understand these instructions.  Will watch your condition.  Will get help right away if you are not doing well or get worse.   This information is not intended to replace advice given to you by your health care provider. Make sure you discuss any questions you have with your health care provider.   Document Released: 08/18/2000 Document Revised: 09/11/2014 Document Reviewed: 01/15/2015 Elsevier Interactive Patient Education 2016 Elsevier Inc.  Cast or Splint Care Casts and splints support injured limbs and keep bones from moving while they heal.  HOME CARE  Keep the cast or splint uncovered during the drying period.  A plaster cast can take 24 to 48 hours to dry.  A fiberglass cast will dry in  less than 1 hour.  Do not rest the cast on anything harder than a pillow for 24 hours.  Do not put weight on your injured limb. Do not put pressure on the cast. Wait for your doctor's approval.  Keep the cast or splint dry.  Cover the cast or splint with a plastic bag during baths or wet weather.  If you have a cast over your chest and belly (trunk), take sponge baths until the cast is  taken off.  If your cast gets wet, dry it with a towel or blow dryer. Use the cool setting on the blow dryer.  Keep your cast or splint clean. Wash a dirty cast with a damp cloth.  Do not put any objects under your cast or splint.  Do not scratch the skin under the cast with an object. If itching is a problem, use a blow dryer on a cool setting over the itchy area.  Do not trim or cut your cast.  Do not take out the padding from inside your cast.  Exercise your joints near the cast as told by your doctor.  Raise (elevate) your injured limb on 1 or 2 pillows for the first 1 to 3 days. GET HELP IF:  Your cast or splint cracks.  Your cast or splint is too tight or too loose.  You itch badly under the cast.  Your cast gets wet or has a soft spot.  You have a bad smell coming from the cast.  You get an object stuck under the cast.  Your skin around the cast becomes red or sore.  You have new or more pain after the cast is put on. GET HELP RIGHT AWAY IF:  You have fluid leaking through the cast.  You cannot move your fingers or toes.  Your fingers or toes turn blue or white or are cool, painful, or puffy (swollen).  You have tingling or lose feeling (numbness) around the injured area.  You have bad pain or pressure under the cast.  You have trouble breathing or have shortness of breath.  You have chest pain.   This information is not intended to replace advice given to you by your health care provider. Make sure you discuss any questions you have with your health care provider.   Document Released: 12/21/2010 Document Revised: 04/23/2013 Document Reviewed: 02/27/2013 Elsevier Interactive Patient Education [employer] Elsevier Inc.

## 2016-03-02 NOTE — ED Notes (Signed)
Patient tripped, caught himself with his right hand, in the process injuring his right middle finger. Obvious deformity noted.

## 2016-03-02 NOTE — ED Provider Notes (Signed)
CSN: [id number]     Arrival date & time 03/02/16  0415 History   First MD Initiated Contact with Michael Strong 03/02/16 [phone] AM    Chief Complaint  Michael Strong presents with  . Finger Injury     (Consider location/radiation/quality/duration/timing/severity/associated sxs/prior Treatment) HPI Michael Strong reports he was playing basketball about 3 AM this morning and he tripped and fell and caught himself with his right hand. He states he has pain and deformity now in his right middle finger. Michael Strong is right-handed. He denies any other injury.  PCP Dr Neita CarpSasser  History reviewed. No pertinent past medical history. History reviewed. No pertinent past surgical history. History reviewed. No pertinent family history. Social History  Substance Use Topics  . Smoking status: Former Games developermoker  . Smokeless tobacco: None  . Alcohol Use: No  employed driving fork lift  Review of Systems  All other systems reviewed and are negative.     Allergies  Review of Michael Strong's allergies indicates no known allergies.  Home Medications  None  Prior to Admission medications   Medication Sig Start Date End Date Taking? Authorizing Provider  benzonatate (TESSALON) 100 MG capsule Take 2 capsules (200 mg total) by mouth 3 (three) times daily as needed. 01/23/16   Burgess AmorJulie Idol, PA-C  cyclobenzaprine (FLEXERIL) 10 MG tablet Take 1 tablet (10 mg total) by mouth 3 (three) times daily as needed for muscle spasms. 07/01/15   Azalia BilisKevin Campos, MD  dicyclomine (BENTYL) 20 MG tablet Take 1 tablet (20 mg total) by mouth 2 (two) times daily as needed for spasms. 10/15/15   Loren Raceravid Yelverton, MD  ibuprofen (ADVIL,MOTRIN) 600 MG tablet Take 1 tablet (600 mg total) by mouth every 8 (eight) hours as needed. 07/01/15   Azalia BilisKevin Campos, MD  naproxen (NAPROSYN) 500 MG tablet Take 1 po BID with food prn pain 03/02/16   Devoria AlbeIva Charrie Mcconnon, MD  polyethylene glycol (MIRALAX / GLYCOLAX) packet Take 17 g by mouth daily. 10/15/15   Loren Raceravid Yelverton, MD   promethazine-codeine (PHENERGAN WITH CODEINE) 6.25-10 MG/5ML syrup Take 5 mLs by mouth every 4 (four) hours as needed for cough. 01/23/16   Burgess AmorJulie Idol, PA-C  traMADol (ULTRAM) 50 MG tablet Take 2 tablets (100 mg total) by mouth every 6 (six) hours as needed. 03/02/16   Devoria AlbeIva Davonne Baby, MD   BP 122/86 mmHg  Pulse 82  Temp(Src) 98.1 F (36.7 C) (Oral)  Resp 16  Ht 5\' 11"  (1.803 m)  Wt 215 lb (97.523 kg)  BMI 30.00 kg/m2  SpO2 98%  Vital signs normal   Physical Exam  Constitutional: He is oriented to person, place, and time. He appears well-developed and well-nourished.  Non-toxic appearance. He does not appear ill. No distress.  HENT:  Head: Normocephalic and atraumatic.  Right Ear: External ear normal.  Left Ear: External ear normal.  Nose: Nose normal. No mucosal edema or rhinorrhea.  Mouth/Throat: Mucous membranes are normal. No dental abscesses or uvula swelling.  Eyes: Conjunctivae and EOM are normal.  Neck: Normal range of motion and full passive range of motion without pain.  Cardiovascular: Normal rate.   Pulmonary/Chest: Effort normal. No respiratory distress. He has no rhonchi. He exhibits no crepitus.  Abdominal: Normal appearance.  Musculoskeletal: Normal range of motion. He exhibits tenderness. He exhibits no edema.  Moves all extremities well except for his right upper extremity. Michael Strong is noted to have a deformity of his right middle finger at the level of the PIP joint ulnar deviation of the distal finger.  Neurological: He is  alert and oriented to person, place, and time. He has normal strength. No cranial nerve deficit.  Skin: Skin is warm, dry and intact. No rash noted. No erythema. No pallor.  Psychiatric: He has a normal mood and affect. His speech is normal and behavior is normal. His mood appears not anxious.  Nursing note and vitals reviewed.   ED Course  Procedures (including critical care time)  Medications  bupivacaine (MARCAINE) 0.5 % injection 10 mL (not  administered)   Recheck at 06:61 AM pt's finger appears to have reduced spontaneously after the digital block. When I palpate the joint it appears to be relocated.  Pt unaware that it happened but agrees it appears to back to normal. Post reduction film ordered.   06:45 AM  NERVE BLOCK Performed by: Ward GivensIva L Dailey Buccheri Consent: Verbal consent obtained. Required items: required blood products, implants, devices, and special equipment available Time out: Immediately prior to procedure a "time out" was called to verify the correct Michael Strong, procedure, equipment, support staff and site/side marked as required.  Indication: dislocation finger  Nerve block body site: base of RMF  Preparation: Michael Strong was prepped and draped in the usual sterile fashion. Needle gauge: 21 G Location technique: anatomical landmarks  Local anesthetic: marcaine 0.5 %   Anesthetic total: 4 ml  Outcome: pain improved Michael Strong tolerance: Michael Strong tolerated the procedure well with no immediate complications.   Imaging Review Dg Hand Complete Right  03/02/2016  CLINICAL DATA:  Basketball injury EXAM: RIGHT HAND - COMPLETE 3+ VIEW COMPARISON:  None. FINDINGS: There is ulnar dislocation at the third PIP. No fracture is evident. No radiopaque foreign body. IMPRESSION: Third PIP ulnar dislocation. Electronically Signed   By: Ellery Plunkaniel R Mitchell M.D.   On: 03/02/2016 05:51   Dg Finger Middle Right  03/02/2016  CLINICAL DATA:  Relocation of fourth DIP joint dislocation EXAM: RIGHT MIDDLE FINGER 2+V COMPARISON:  03/02/2016 FINDINGS: The previously seen dislocation has been reduced. A tiny bony density is noted adjacent to the base of the middle phalanx along the palmar aspect which may represent a small avulsion. No other focal abnormality is noted. IMPRESSION: Reduction of previously seen PIP joint dislocation. Question small avulsion. Electronically Signed   By: Alcide CleverMark  Lukens M.D.   On: 03/02/2016 07:17   I have personally reviewed  and evaluated these images and lab results as part of my medical decision-making.    MDM   Final diagnoses:  Dislocation of proximal interphalangeal joint of right middle finger, initial encounter   New Prescriptions   NAPROXEN (NAPROSYN) 500 MG TABLET    Take 1 po BID with food prn pain   TRAMADOL (ULTRAM) 50 MG TABLET    Take 2 tablets (100 mg total) by mouth every 6 (six) hours as needed.    Plan discharge  Devoria AlbeIva Levy Wellman, MD, Concha PyoFACEP    Shanequia Kendrick, MD 03/02/16 [phone]

## 2016-03-13 ENCOUNTER — Emergency Department (HOSPITAL_COMMUNITY): Payer: Self-pay

## 2016-03-13 ENCOUNTER — Encounter (HOSPITAL_COMMUNITY): Payer: Self-pay | Admitting: Emergency Medicine

## 2016-03-13 ENCOUNTER — Emergency Department (HOSPITAL_COMMUNITY)
Admission: EM | Admit: 2016-03-13 | Discharge: 2016-03-13 | Disposition: A | Discharge status: Home / Self Care | Payer: Self-pay | Attending: Emergency Medicine | Admitting: Emergency Medicine

## 2016-03-13 DIAGNOSIS — Z79899 Other long term (current) drug therapy: Secondary | ICD-10-CM | POA: Insufficient documentation

## 2016-03-13 DIAGNOSIS — X58XXXS Exposure to other specified factors, sequela: Secondary | ICD-10-CM | POA: Insufficient documentation

## 2016-03-13 DIAGNOSIS — Y939 Activity, unspecified: Secondary | ICD-10-CM | POA: Insufficient documentation

## 2016-03-13 DIAGNOSIS — S61209S Unspecified open wound of unspecified finger without damage to nail, sequela: Secondary | ICD-10-CM

## 2016-03-13 DIAGNOSIS — S68112S Complete traumatic metacarpophalangeal amputation of right middle finger, sequela: Secondary | ICD-10-CM | POA: Insufficient documentation

## 2016-03-13 DIAGNOSIS — Y929 Unspecified place or not applicable: Secondary | ICD-10-CM | POA: Insufficient documentation

## 2016-03-13 DIAGNOSIS — Y999 Unspecified external cause status: Secondary | ICD-10-CM | POA: Insufficient documentation

## 2016-03-13 DIAGNOSIS — Z791 Long term (current) use of non-steroidal anti-inflammatories (NSAID): Secondary | ICD-10-CM | POA: Insufficient documentation

## 2016-03-13 MED ORDER — NAPROXEN 500 MG PO TABS: 500.0000 mg | ORAL_TABLET | Freq: Two times a day (BID) | ORAL | Status: DC

## 2016-03-13 NOTE — ED Notes (Signed)
Pt states he is here for a recheck of dislocated right middle finger 2 weeks ago.  Swelling noted and pt not wearing splint.

## 2016-03-13 NOTE — Discharge Instructions (Signed)
Keep the finger splinted. Take the medicine to reduce the swelling. Ice the finger 4 times a day for 5 min. See the hand specialist as requested.  Finger Dislocation Finger dislocation is the displacement of bones in your finger at the joints. Most commonly, finger dislocation occurs at the proximal interphalangeal joint (the joint closest to your knuckle). Very strong, fibrous tissues (ligaments) and joint capsules connect the three bones of your fingers.  CAUSES Dislocation is caused by a forceful impact. This impact moves these bones off the joint and often tears your ligaments.  SYMPTOMS Symptoms of finger dislocation include:  Deformity of your finger.  Pain, with loss of movement. DIAGNOSIS  Finger dislocation is diagnosed with a physical exam. Often, X-ray exams are done to see if you have associated injuries, such as bone fractures. TREATMENT  Finger dislocations are treated by putting your bones back into position (reduction) either by manually moving the bones back into place or through surgery. Your finger is then kept in a fixed position (immobilized) with the use of a dressing or splint for a brief period. When your ligament has to be surgically repaired, it needs to be kept in a fixed position with a dressing or splint for 1 to 2 weeks. Because joint stiffness is a long-term complication of finger dislocation, hand exercises or physical therapy to increase the range of motion and to regain strength is usually started as soon as the ligament is healed. Exercises and therapy generally last no more than 3 months. HOME CARE INSTRUCTIONS The following measures can help to reduce pain and speed up the healing process:  Rest your injured joint. Do not move until instructed otherwise by your caregiver. Avoid activities similar to the one that caused your injury.  Apply ice to your injured joint for the first day or 2 after your reduction or as directed by your caregiver. Applying ice  helps to reduce inflammation and pain.  Put ice in a plastic bag.  Place a towel between your skin and the bag.  Leave the ice on for 15-20 minutes at a time, every 2 hours while you are awake.  Elevate your hand above your heart as directed by your caregiver to reduce swelling.  Take over-the-counter or prescription medicine for pain as your caregiver instructs you. SEEK IMMEDIATE MEDICAL CARE IF:  Your dressing or splint becomes damaged.  Your pain becomes worse rather than better.  You lose feeling in your finger, or it becomes cold and white. MAKE SURE YOU:  Understand these instructions.  Will watch your condition.  Will get help right away if you are not doing well or get worse.   This information is not intended to replace advice given to you by your health care provider. Make sure you discuss any questions you have with your health care provider.   Document Released: 08/18/2000 Document Revised: 09/11/2014 Document Reviewed: 01/15/2015 Elsevier Interactive Patient Education [employer]2016 [employer] Inc.

## 2016-03-13 NOTE — ED Provider Notes (Signed)
CSN: [id number]     Arrival date & time 03/13/16  1214 History   First MD Initiated Contact with Patient 03/13/16 1401     Chief Complaint  Patient presents with  . Finger Injury     (Consider location/radiation/quality/duration/timing/severity/associated sxs/prior Treatment) HPI Comments: Pt comes in for repeat evaluation of his finger. Pt dislocated his digit several days ago which was reduced in the ER. Pt has not been keeping the finger in the splint, or icing the digit. He hasnt see hand doctor either. Comes in to the ER as swelling has increased.  The history is provided by the patient.    History reviewed. No pertinent past medical history. History reviewed. No pertinent past surgical history. History reviewed. No pertinent family history. Social History  Substance Use Topics  . Smoking status: Former Games developermoker  . Smokeless tobacco: None  . Alcohol Use: No    Review of Systems  Constitutional: Positive for activity change.  Musculoskeletal: Positive for arthralgias.  Skin: Negative for wound.      Allergies  Review of patient's allergies indicates no known allergies.  Home Medications   Prior to Admission medications   Medication Sig Start Date End Date Taking? Authorizing Provider  acetaminophen (TYLENOL) 500 MG tablet Take 500 mg by mouth every 6 (six) hours as needed for moderate pain.   Yes Historical Provider, MD  traMADol (ULTRAM) 50 MG tablet Take 2 tablets (100 mg total) by mouth every 6 (six) hours as needed. 03/02/16  Yes Devoria AlbeIva Knapp, MD  benzonatate (TESSALON) 100 MG capsule Take 2 capsules (200 mg total) by mouth 3 (three) times daily as needed. Patient not taking: Reported on 03/13/2016 01/23/16   Burgess AmorJulie Idol, PA-C  ibuprofen (ADVIL,MOTRIN) 600 MG tablet Take 1 tablet (600 mg total) by mouth every 8 (eight) hours as needed. Patient not taking: Reported on 03/13/2016 07/01/15   Azalia BilisKevin Campos, MD  naproxen (NAPROSYN) 500 MG tablet Take 1 tablet (500 mg total) by  mouth 2 (two) times daily. 03/13/16   Derwood KaplanAnkit Haydon Dorris, MD  polyethylene glycol (MIRALAX / GLYCOLAX) packet Take 17 g by mouth daily. Patient not taking: Reported on 03/13/2016 10/15/15   Loren Raceravid Yelverton, MD  promethazine-codeine [facility](PHENERGAN WITH CODEINE) 6.25-10 MG/5ML syrup Take 5 mLs by mouth every 4 (four) hours as needed for cough. Patient not taking: Reported on 03/13/2016 01/23/16   Burgess AmorJulie Idol, PA-C   BP 111/89 mmHg  Pulse 73  Temp(Src) 97.7 F (36.5 C) (Oral)  Resp 16  Ht 5\' 11"  (1.803 m)  Wt 215 lb (97.523 kg)  BMI 30.00 kg/m2  SpO2 100% Physical Exam  Constitutional: He is oriented to person, place, and time. He appears well-developed.  HENT:  Head: Atraumatic.  Neck: Neck supple.  Cardiovascular: Normal rate.   Pulmonary/Chest: Effort normal.  Musculoskeletal:  Swelling over the PIP of the long digit, R. Pt has tenderness to palpation. Pt's ROM is compromised over the PIP.   Neurological: He is alert and oriented to person, place, and time.  Skin: Skin is warm.  Nursing note and vitals reviewed.   ED Course  Procedures (including critical care time) Labs Review Labs Reviewed - No data to display  Imaging Review Dg Finger Middle Right  03/13/2016  CLINICAL DATA:  Right middle finger pain and swelling following a dislocation on 03/02/2016. EXAM: RIGHT MIDDLE FINGER 2+V COMPARISON:  03/02/2016. FINDINGS: Progressive diffuse soft tissue swelling at the level of the third PIP joint. Tiny avulsion bone fragment adjacent to the ulnar, dorsal  aspect of the third PIP joint. No other fractures and no dislocation. IMPRESSION: Progressive soft tissue swelling at the third PIP joint with a tiny avulsion bone fragment dorsally. Electronically Signed   By: Beckie Salts M.D.   On: 03/13/2016 12:43   I have personally reviewed and evaluated these images and lab results as part of my medical decision-making.   EKG Interpretation None      MDM   Final diagnoses:  Avulsion, finger  tip, sequela    Pt is s/p reduction of the PIP in the ER of the R long digit. Poor compliance with post reduction maintenance and no f.u, He has some avulsion. Xrays confirm.  Nothing to do acutely in the Er, will splint again, aggressive ice advised and f.u recommended.    Derwood Kaplan, MD 03/13/16 1513

## 2016-06-06 ENCOUNTER — Emergency Department (HOSPITAL_COMMUNITY): Payer: Self-pay

## 2016-06-06 ENCOUNTER — Encounter (HOSPITAL_COMMUNITY): Payer: Self-pay | Admitting: *Deleted

## 2016-06-06 ENCOUNTER — Emergency Department (HOSPITAL_COMMUNITY)
Admission: EM | Admit: 2016-06-06 | Discharge: 2016-06-06 | Disposition: A | Discharge status: Home / Self Care | Payer: Self-pay | Attending: Emergency Medicine | Admitting: Emergency Medicine

## 2016-06-06 DIAGNOSIS — R319 Hematuria, unspecified: Secondary | ICD-10-CM | POA: Insufficient documentation

## 2016-06-06 LAB — URINALYSIS, ROUTINE W REFLEX MICROSCOPIC
Bilirubin Urine: NEGATIVE
Glucose, UA: NEGATIVE mg/dL
Ketones, ur: NEGATIVE mg/dL
LEUKOCYTES UA: NEGATIVE
Nitrite: NEGATIVE
PROTEIN: NEGATIVE mg/dL
Specific Gravity, Urine: 1.025 (ref 1.005–1.030)
pH: 6 (ref 5.0–8.0)

## 2016-06-06 LAB — URINE MICROSCOPIC-ADD ON
SQUAMOUS EPITHELIAL / LPF: NONE SEEN
WBC UA: NONE SEEN WBC/hpf (ref 0–5)

## 2016-06-06 LAB — CBG MONITORING, ED: GLUCOSE-CAPILLARY: 93 mg/dL (ref 65–99)

## 2016-06-06 MED ORDER — AZITHROMYCIN 250 MG PO TABS
1000.0000 mg | ORAL_TABLET | Freq: Once | ORAL | Status: AC
  Administered 2016-06-06: 1000 mg via ORAL
  Filled 2016-06-06: qty 4

## 2016-06-06 MED ORDER — LIDOCAINE HCL (PF) 1 % IJ SOLN
INTRAMUSCULAR | Status: AC
  Administered 2016-06-06: 05:00:00
  Filled 2016-06-06: qty 5

## 2016-06-06 MED ORDER — CEFTRIAXONE SODIUM 250 MG IJ SOLR
250.0000 mg | Freq: Once | INTRAMUSCULAR | Status: AC
  Administered 2016-06-06: 250 mg via INTRAMUSCULAR
  Filled 2016-06-06: qty 250

## 2016-06-06 NOTE — ED Notes (Addendum)
Pt states burning after he urinates. Unprotected sex about 2 weeks ago.

## 2016-06-06 NOTE — ED Provider Notes (Signed)
[facility] DEPT Provider Note   CSN: [id number] Arrival date & time: 06/06/16  4098     History   Chief Complaint Chief Complaint  Patient presents with  . Dysuria    HPI Michael Strong is a 35 y.o. male.  Patient with 3 day history of burning with urination and at the end of urination. Denies frequency or urgency. Denies hematuria. Denies fever or vomiting. Denies testicular pain. Denies any penile bleeding or discharge. No abdominal pain, flank or back pain. Reports there is burning after he urinates it lasts for about 15 minutes and resolves. He is not burning currently. He did have unprotected sex about 2 weeks ago. No testicular pain. No penile discharge.   The history is provided by the patient.  Dysuria   Associated symptoms include urgency. Pertinent negatives include no nausea, no vomiting and no frequency.    History reviewed. No pertinent past medical history.  Patient Active Problem List   Diagnosis Date Noted  . Fracture of navicular bone of foot, right, closed 04/30/2012    History reviewed. No pertinent surgical history.     Home Medications    Prior to Admission medications   Not on File    Family History History reviewed. No pertinent family history.  Social History Social History  Substance Use Topics  . Smoking status: Former Games developer  . Smokeless tobacco: Never Used  . Alcohol use No     Allergies   Review of patient's allergies indicates no known allergies.   Review of Systems Review of Systems  Constitutional: Negative for activity change, appetite change and fever.  HENT: Negative for congestion.   Eyes: Negative for visual disturbance.  Respiratory: Negative for chest tightness and shortness of breath.   Cardiovascular: Negative for chest pain.  Gastrointestinal: Negative for abdominal pain, nausea and vomiting.  Genitourinary: Positive for dysuria and urgency. Negative for discharge, frequency and penile swelling.    Musculoskeletal: Negative for arthralgias and myalgias.  Neurological: Negative for dizziness, weakness, light-headedness and headaches.  A complete 10 system review of systems was obtained and all systems are negative except as noted in the HPI and PMH.     Physical Exam Updated Vital Signs BP 150/94 (BP Location: Left Arm)   Pulse 79   Temp 97.8 F (36.6 C) (Oral)   Resp 16   Ht 5\' 10"  (1.778 m)   Wt 215 lb (97.5 kg)   SpO2 98%   BMI 30.85 kg/m   Physical Exam  Constitutional: He is oriented to person, place, and time. He appears well-developed and well-nourished. No distress.  HENT:  Head: Normocephalic and atraumatic.  Mouth/Throat: Oropharynx is clear and moist. No oropharyngeal exudate.  Eyes: Conjunctivae and EOM are normal. Pupils are equal, round, and reactive to light.  Neck: Normal range of motion. Neck supple.  No meningismus.  Cardiovascular: Normal rate, regular rhythm, normal heart sounds and intact distal pulses.   No murmur heard. Pulmonary/Chest: Effort normal and breath sounds normal. No respiratory distress.  Abdominal: Soft. There is no tenderness. There is no rebound and no guarding.  Genitourinary:  Genitourinary Comments: Testicles nontender No penile discharge.  Musculoskeletal: Normal range of motion. He exhibits no edema or tenderness.  Neurological: He is alert and oriented to person, place, and time. No cranial nerve deficit. He exhibits normal muscle tone. Coordination normal.  No ataxia on finger to nose bilaterally. No pronator drift. 5/5 strength throughout. CN 2-12 intact.Equal grip strength. Sensation intact.   Skin: Skin  is warm.  Psychiatric: He has a normal mood and affect. His behavior is normal.  Nursing note and vitals reviewed.    ED Treatments / Results  Labs (all labs ordered are listed, but only abnormal results are displayed) Labs Reviewed  URINALYSIS, ROUTINE W REFLEX MICROSCOPIC (NOT AT [facility]) - Abnormal; Notable for the  following:       Result Value   Hgb urine dipstick MODERATE (*)    All other components within normal limits  URINE MICROSCOPIC-ADD ON - Abnormal; Notable for the following:    Bacteria, UA FEW (*)    All other components within normal limits  CBG MONITORING, ED    EKG  EKG Interpretation None       Radiology Ct Renal Stone Study  Result Date: 06/06/2016 CLINICAL DATA:  Burning with urination for the last few days. Hematuria EXAM: CT ABDOMEN AND PELVIS WITHOUT CONTRAST TECHNIQUE: Multidetector CT imaging of the abdomen and pelvis was performed following the standard protocol without IV contrast. COMPARISON:  None. FINDINGS: Lower chest: Linear atelectasis in the lung bases. Hepatobiliary: Unenhanced appearance is unremarkable. Pancreas: Unenhanced appearance is unremarkable. Spleen: Unenhanced appearance is unremarkable. Adrenals/Urinary Tract: No adrenal gland nodules. Kidneys are symmetrical. No hydronephrosis or hydroureter. No renal, ureteral, or bladder stones. Bladder is decompressed. Anterior tenting of the dome of the bladder may indicate urachal remnant. No mass appreciated. Stomach/Bowel: Stomach, small bowel, and colon are not abnormally distended. No bowel wall thickening is appreciated. Stool-filled colon. Appendix is at the upper limits of normal diameter, measuring 9 mm diameter. No periappendiceal stranding. Appearance could be associated with early acute appendicitis in the appropriate clinical setting. No abscess. Vascular/Lymphatic: No significant vascular findings are present. No enlarged abdominal or pelvic lymph nodes. Reproductive: Prostate gland is not enlarged. Other: No abdominal wall hernia or abnormality. No abdominopelvic ascites. Musculoskeletal: No acute or significant osseous findings. IMPRESSION: No renal or ureteral stone or obstruction. Bladder is decompressed limiting evaluation but possible urachal remnant. Appendiceal diameter is mildly increased but no  inflammatory changes are demonstrated around the appendix. Can't exclude early acute appendicitis. Electronically Signed   By: Burman NievesWilliam  Stevens M.D.   On: 06/06/2016 04:41    Procedures Procedures (including critical care time)  Medications Ordered in ED Medications - No data to display   Initial Impression / Assessment and Plan / ED Course  I have reviewed the triage vital signs and the nursing notes.  Pertinent labs & imaging results that were available during my care of the patient were reviewed by me and considered in my medical decision making (see chart for details).  Clinical Course  Burning with urination x3 days.  Unprotected sex 2 weeks ago. No abdominal pain, flank pain or back pain.  CBG normal. UA with hematuria, no infection  Will check CT renal stone.  CT negative for kidney or ureteral stone. Exam is not consistent with early appendicitis. No inflammatory changes on CT. Patient with no abdominal pain. States he has been told in the past that his appendix was enlarged.  Will treat empirically for possible urethritis.  Followup with urology. Return precautions discussed. Return to the ED if develops RLQ pain, fever, vomiting or any other concerns.  Final Clinical Impressions(s) / ED Diagnoses   Final diagnoses:  Hematuria    New Prescriptions New Prescriptions   No medications on file     Glynn OctaveStephen Carrine Kroboth, MD 06/06/16 0510

## 2016-06-06 NOTE — Discharge Instructions (Signed)
There are no kidney stones. Followup with the urologist to further evaluate the blood in your urine. Return to the ED if you develop new or worsening symptoms.

## 2016-06-06 NOTE — ED Triage Notes (Signed)
Pt c/o burning with urination for the last few days; pt denies any abdominal pain, n/v or hematuria

## 2016-10-07 ENCOUNTER — Encounter (HOSPITAL_COMMUNITY): Payer: Self-pay | Admitting: Emergency Medicine

## 2016-10-07 ENCOUNTER — Emergency Department (HOSPITAL_COMMUNITY)
Admission: EM | Admit: 2016-10-07 | Discharge: 2016-10-07 | Disposition: A | Discharge status: Home / Self Care | Payer: Self-pay | Attending: Emergency Medicine | Admitting: Emergency Medicine

## 2016-10-07 ENCOUNTER — Emergency Department (HOSPITAL_COMMUNITY): Payer: Self-pay

## 2016-10-07 DIAGNOSIS — Z87891 Personal history of nicotine dependence: Secondary | ICD-10-CM | POA: Insufficient documentation

## 2016-10-07 DIAGNOSIS — Y939 Activity, unspecified: Secondary | ICD-10-CM | POA: Insufficient documentation

## 2016-10-07 DIAGNOSIS — Y999 Unspecified external cause status: Secondary | ICD-10-CM | POA: Insufficient documentation

## 2016-10-07 DIAGNOSIS — S63632A Sprain of interphalangeal joint of right middle finger, initial encounter: Secondary | ICD-10-CM | POA: Insufficient documentation

## 2016-10-07 DIAGNOSIS — W228XXA Striking against or struck by other objects, initial encounter: Secondary | ICD-10-CM | POA: Insufficient documentation

## 2016-10-07 DIAGNOSIS — Y929 Unspecified place or not applicable: Secondary | ICD-10-CM | POA: Insufficient documentation

## 2016-10-07 MED ORDER — DICLOFENAC SODIUM 75 MG PO TBEC: 75.0000 mg | DELAYED_RELEASE_TABLET | Freq: Two times a day (BID) | ORAL | 0 refills | Status: DC

## 2016-10-07 NOTE — Discharge Instructions (Signed)
Apply ice packs on/off to your finger.  Keep it splinted for at least one week.  Call one of the orthopedic doctors listed to arrange a follow-up appt.

## 2016-10-07 NOTE — ED Provider Notes (Signed)
[facility] Provider Note   CSN: [id number] Arrival date & time: 10/07/16  1622     History   Chief Complaint Chief Complaint  Patient presents with  . Finger Injury    HPI Michael Strong is a 36 y.o. male.  HPI   Michael Strong is a 36 y.o. male who presents to the Emergency Department complaining of Pain and swelling to the right middle finger. This is a recurrent injury. He states he suffered a dislocation of the PIP joint of the same finger in June 2017. He states his finger was splinted and he was advised to follow-up with orthopedics but he never did so. He reports swelling and deformity of the finger which is chronic and secondary to the initial injury. Last evening he states that he accidentally struck his finger on a large box which resulted in increased pain and swelling to the finger. He reports limited range of motion to the middle of his finger, but states this is also chronic. He denies open wound, numbness to the finger or pain proximal to the finger. He applied ice last evening, but none since.   History reviewed. No pertinent past medical history.  Patient Active Problem List   Diagnosis Date Noted  . Fracture of navicular bone of foot, right, closed 04/30/2012    History reviewed. No pertinent surgical history.     Home Medications    Prior to Admission medications   Medication Sig Start Date End Date Taking? Authorizing Provider  diclofenac (VOLTAREN) 75 MG EC tablet Take 1 tablet (75 mg total) by mouth 2 (two) times daily. Take with food 10/07/16   Pauline Aus, PA-C    Family History History reviewed. No pertinent family history.  Social History Social History  Substance Use Topics  . Smoking status: Former Games developer  . Smokeless tobacco: Never Used  . Alcohol use No     Allergies   Patient has no known allergies.   Review of Systems Review of Systems  Constitutional: Negative for chills and fever.  Cardiovascular: Negative  for chest pain.  Musculoskeletal: Positive for arthralgias and joint swelling.  Skin: Negative for color change and wound.  Neurological: Negative for weakness and numbness.  All other systems reviewed and are negative.    Physical Exam Updated Vital Signs BP 132/81 (BP Location: Left Arm)   Pulse 75   Temp 97.9 F (36.6 C) (Oral)   Resp 18   Ht 5\' 11"  (1.803 m)   Wt 99.8 kg   SpO2 99%   BMI 30.68 kg/m   Physical Exam  Constitutional: He is oriented to person, place, and time. He appears well-developed and well-nourished. No distress.  HENT:  Head: Normocephalic and atraumatic.  Cardiovascular: Normal rate, regular rhythm and intact distal pulses.   Pulmonary/Chest: Effort normal and breath sounds normal.  Musculoskeletal: He exhibits edema, tenderness and deformity.  ttp of the PIP joint of the right middle finger.  Ulnar deviation present.  No erythema, or open wound.   Radial pulse is brisk, distal sensation intact.  CR< 2 sec. No tenderness proximal to the PIP  Neurological: He is alert and oriented to person, place, and time. He exhibits normal muscle tone. Coordination normal.  Skin: Skin is warm and dry.  Nursing note and vitals reviewed.    ED Treatments / Results  Labs (all labs ordered are listed, but only abnormal results are displayed) Labs Reviewed - No data to display  EKG  EKG Interpretation None  Radiology Dg Finger Middle Right  Result Date: 10/07/2016 CLINICAL DATA:  Right third finger injury last night with swelling and limited range of motion. EXAM: RIGHT MIDDLE FINGER 2+V COMPARISON:  03/13/2016 right third finger radiographs FINDINGS: Prominent soft tissue swelling surrounding the proximal interphalangeal joint of the right third finger. No fracture or dislocation. No appreciable degenerative or erosive arthropathy. No suspicious focal osseous lesion. No radiopaque foreign body. IMPRESSION: Prominent soft tissues swelling surrounding the PIP  joint of the right third finger, with no fracture or dislocation. Electronically Signed   By: Delbert PhenixJason A Poff M.D.   On: 10/07/2016 17:26    Procedures Procedures (including critical care time)  Medications Ordered in ED Medications - No data to display   Initial Impression / Assessment and Plan / ED Course  I have reviewed the triage vital signs and the nursing notes.  Pertinent labs & imaging results that were available during my care of the patient were reviewed by me and considered in my medical decision making (see chart for details).     Pt with disloc of PIP June 2017.  Previous XR compared.  XR today neg for new fx or dislocation. NV intact.  Ulnar deviation of the PIP which is chronic per patient. Limited ROM of the finger also stated as chronic.  Increased swelling likely due to strain of the finger.   Finger splint applied,  Advised pt of need for orthopedic f/u.  Given referral info for hand surgeon and local orthopedic  Final Clinical Impressions(s) / ED Diagnoses   Final diagnoses:  Sprain of interphalangeal joint of right middle finger, initial encounter    New Prescriptions New Prescriptions   DICLOFENAC (VOLTAREN) 75 MG EC TABLET    Take 1 tablet (75 mg total) by mouth 2 (two) times daily. Take with food     Pauline Ausammy Yona Stansbury, PA-C 10/07/16 1806    Lavera Guiseana Duo Liu, MD 10/08/16 1224

## 2016-10-07 NOTE — ED Triage Notes (Signed)
Pt reports hitting middle finger on right hand on a box last night and has swelling to finger. Pt has limited rom, appropriate sensation, cap refill less than 3 seconds.  Pt reports that he injured this finger last year during the summer.

## 2017-02-21 ENCOUNTER — Emergency Department (HOSPITAL_COMMUNITY)
Admission: EM | Admit: 2017-02-21 | Discharge: 2017-02-21 | Disposition: A | Discharge status: Home / Self Care | Payer: Self-pay | Attending: Emergency Medicine | Admitting: Emergency Medicine

## 2017-02-21 ENCOUNTER — Encounter (HOSPITAL_COMMUNITY): Payer: Self-pay | Admitting: *Deleted

## 2017-02-21 ENCOUNTER — Emergency Department (HOSPITAL_COMMUNITY): Payer: Self-pay

## 2017-02-21 DIAGNOSIS — M79601 Pain in right arm: Secondary | ICD-10-CM | POA: Insufficient documentation

## 2017-02-21 DIAGNOSIS — Z791 Long term (current) use of non-steroidal anti-inflammatories (NSAID): Secondary | ICD-10-CM | POA: Insufficient documentation

## 2017-02-21 DIAGNOSIS — Z87891 Personal history of nicotine dependence: Secondary | ICD-10-CM | POA: Insufficient documentation

## 2017-02-21 DIAGNOSIS — M25511 Pain in right shoulder: Secondary | ICD-10-CM

## 2017-02-21 NOTE — ED Provider Notes (Signed)
[facility]-EM[facility]Y DEPT Provider Note   CSN: [id number] Arrival date & time: 02/21/17  4098     History   Chief Complaint Chief Complaint  Patient presents with  . Shoulder Pain    HPI [facility] is a 36 y.o. male.  HPI Patient presents to ED for complaints of right shoulder pain that began upon waking up this morning. Increased pain with abduction of the arm. He denies any radiation of pain. He denies any numbness, weakness or temperature change of joint. He reports similar history of pain in the past which has resolved on its own. He denies any injury, trauma or accident that incited the pain. Has not taken any medications PTA for symptomatic relief. He denies fever, decreased strength, shortness of breath, chest pain, nausea, vomiting, headache, changes in gait or vision changes.  History reviewed. No pertinent past medical history.  Patient Active Problem List   Diagnosis Date Noted  . Fracture of navicular bone of foot, right, closed 04/30/2012    History reviewed. No pertinent surgical history.     Home Medications    Prior to Admission medications   Medication Sig Start Date End Date Taking? Authorizing Provider  diclofenac (VOLTAREN) 75 MG EC tablet Take 1 tablet (75 mg total) by mouth 2 (two) times daily. Take with food 10/07/16   Triplett, Tammy, PA-C    Family History No family history on file.  Social History Social History  Substance Use Topics  . Smoking status: Former Games developer  . Smokeless tobacco: Never Used  . Alcohol use No     Allergies   Patient has no known allergies.   Review of Systems Review of Systems  Constitutional: Negative for activity change, appetite change, chills and fever.  Respiratory: Negative for shortness of breath.   Cardiovascular: Negative for chest pain.  Gastrointestinal: Negative for nausea and vomiting.  Musculoskeletal: Positive for arthralgias. Negative for gait problem, joint swelling, neck pain and neck  stiffness.  Skin: Negative for color change and wound.     Physical Exam Updated Vital Signs BP 135/81   Pulse 87   Temp 98.4 F (36.9 C) (Oral)   Resp 16   Ht 5\' 10"  (1.778 m)   Wt 97.5 kg (215 lb)   SpO2 97%   BMI 30.85 kg/m   Physical Exam  Constitutional: He appears well-developed and well-nourished. No distress.  HENT:  Head: Normocephalic and atraumatic.  Eyes: Conjunctivae and EOM are normal. No scleral icterus.  Neck: Normal range of motion.  Pulmonary/Chest: Effort normal. No respiratory distress.  Musculoskeletal: He exhibits tenderness. He exhibits no edema or deformity.  Pain with ABduction of the arm. Negative empty can test. Tenderness to palpation over the musculature but no bony tenderness noted. No palpable deformity. No collarbone tenderness. Normal range of motion of the neck with no C-spine tenderness. There is no temperature or color change or visible bruising noted in the area. 2+ radial pulses bilaterally. Sensation intact to light touch.  Neurological: He is alert.  Skin: No rash noted. He is not diaphoretic.  Psychiatric: He has a normal mood and affect.  Nursing note and vitals reviewed.    ED Treatments / Results  Labs (all labs ordered are listed, but only abnormal results are displayed) Labs Reviewed - No data to display  EKG  EKG Interpretation None       Radiology Dg Shoulder Right  Result Date: 02/21/2017 CLINICAL DATA:  Acute onset of right shoulder pain. Initial encounter. EXAM: RIGHT  SHOULDER - 2+ VIEW COMPARISON:  Right shoulder radiographs performed 04/17/2008 FINDINGS: There is no evidence of fracture or dislocation. The right humeral head is seated within the glenoid fossa. Mild chronic cortical irregularity is noted about the glenoid fossa. The acromioclavicular joint is unremarkable in appearance. No significant soft tissue abnormalities are seen. The visualized portions of the right lung are clear. IMPRESSION: No evidence of  fracture or dislocation. Electronically Signed   By: Roanna RaiderJeffery  Chang M.D.   On: 02/21/2017 20:22    Procedures Procedures (including critical care time)  Medications Ordered in ED Medications - No data to display   Initial Impression / Assessment and Plan / ED Course  I have reviewed the triage vital signs and the nursing notes.  Pertinent labs & imaging results that were available during my care of the patient were reviewed by me and considered in my medical decision making (see chart for details).     Patient presents to ED for complaints of right shoulder pain that began this morning after waking up. He states that he is unsure if he slept on the area wrong. He reports previous history of the symptoms which resolved on their own several years ago. He denies any previous fracture, dislocation or procedure done in the area. He denies any injury, accident or trauma to the area. He is afebrile with no history of fever. On physical exam there is tenderness to palpation of the overlying musculature in the right shoulder but no bony tenderness or palpable deformity. There is no temperature change or color change to suggest septic joint or visible wound concerning for osteomyelitis. There is no palpable effusion noted or visualized. X-ray was negative for fracture, dislocation or effusion. I advised patient to take anti-inflammatories, do stretches and apply heat as tolerated to area. Advised him to follow up with his PCP for further evaluation. Patient appears stable for discharge at this time. Strict return precautions given.  Final Clinical Impressions(s) / ED Diagnoses   Final diagnoses:  Right shoulder pain, unspecified chronicity    New Prescriptions Discharge Medication List as of 02/21/2017  8:29 PM       Dietrich PatesKhatri, Ferrin Liebig, PA-C 02/21/17 2055    Loren RacerYelverton, David, MD 02/25/17 [phone]

## 2017-02-21 NOTE — Discharge Instructions (Signed)
Take ibuprofen or Aleve as needed for pain and inflammation. Stretch area and apply heat as tolerated. Follow-up with PCP for further evaluation. Return to ED for worsening pain, injury, fever, trouble moving extremity, numbness or temperature change.

## 2017-02-21 NOTE — ED Triage Notes (Signed)
Pt c/o right shoulder pain that started this am, denies any injury, pain is worse with movement,

## 2017-05-26 ENCOUNTER — Encounter (HOSPITAL_COMMUNITY): Payer: Self-pay | Admitting: *Deleted

## 2017-05-26 ENCOUNTER — Emergency Department (HOSPITAL_COMMUNITY)
Admission: EM | Admit: 2017-05-26 | Discharge: 2017-05-26 | Disposition: A | Discharge status: Home / Self Care | Payer: Self-pay | Attending: Emergency Medicine | Admitting: Emergency Medicine

## 2017-05-26 DIAGNOSIS — G44209 Tension-type headache, unspecified, not intractable: Secondary | ICD-10-CM | POA: Insufficient documentation

## 2017-05-26 DIAGNOSIS — Z87891 Personal history of nicotine dependence: Secondary | ICD-10-CM | POA: Insufficient documentation

## 2017-05-26 DIAGNOSIS — Z79899 Other long term (current) drug therapy: Secondary | ICD-10-CM | POA: Insufficient documentation

## 2017-05-26 MED ORDER — DIPHENHYDRAMINE HCL 25 MG PO CAPS
25.0000 mg | ORAL_CAPSULE | Freq: Once | ORAL | Status: AC
  Administered 2017-05-26: 25 mg via ORAL
  Filled 2017-05-26: qty 1

## 2017-05-26 MED ORDER — KETOROLAC TROMETHAMINE 60 MG/2ML IM SOLN
60.0000 mg | Freq: Once | INTRAMUSCULAR | Status: AC
Start: 2017-05-26 — End: 2017-05-26
  Administered 2017-05-26: 60 mg via INTRAMUSCULAR
  Filled 2017-05-26: qty 2

## 2017-05-26 MED ORDER — METOCLOPRAMIDE HCL 5 MG/ML IJ SOLN
10.0000 mg | Freq: Once | INTRAMUSCULAR | Status: AC
  Administered 2017-05-26: 10 mg via INTRAMUSCULAR
  Filled 2017-05-26: qty 2

## 2017-05-26 NOTE — ED Triage Notes (Signed)
Frontal headache for 2 days

## 2017-05-26 NOTE — Discharge Instructions (Signed)
Follow-up with your doctor for recheck.  Return to Er for any worsening symptoms

## 2017-05-26 NOTE — ED Provider Notes (Signed)
[facility] Provider Note   CSN: [id number] Arrival date & time: 05/26/17  1848     History   Chief Complaint Chief Complaint  Patient presents with  . Headache    HPI Michael Strong is a 36 y.o. male.  HPI   [facility] is a 36 y.o. male who presents to the Emergency Department complaining of gradual onset of frontal headache.  Symptoms have been waxing and waning for 2 days.  States the headache improves somewhat with OTC pain relievers, but returns.  Describes as throbbing sensation to forehead and behind his eyes.  Mild light sensitivity,Similar to previous headaches.  He denies fever, neck pain or stiffness, vomiting, numbness or weakness of the face or extremities.   History reviewed. No pertinent past medical history.  Patient Active Problem List   Diagnosis Date Noted  . Fracture of navicular bone of foot, right, closed 04/30/2012    History reviewed. No pertinent surgical history.     Home Medications    Prior to Admission medications   Medication Sig Start Date End Date Taking? Authorizing Provider  diclofenac (VOLTAREN) 75 MG EC tablet Take 1 tablet (75 mg total) by mouth 2 (two) times daily. Take with food 10/07/16   Eshaan Titzer, PA-C    Family History No family history on file.  Social History Social History  Substance Use Topics  . Smoking status: Former Games developer  . Smokeless tobacco: Never Used  . Alcohol use No     Allergies   Patient has no known allergies.   Review of Systems Review of Systems  Constitutional: Negative for activity change, appetite change and fever.  HENT: Negative for facial swelling and trouble swallowing.   Eyes: Positive for photophobia. Negative for pain and visual disturbance.  Respiratory: Negative for shortness of breath.   Cardiovascular: Negative for chest pain.  Gastrointestinal: Negative for nausea and vomiting.  Musculoskeletal: Negative for neck pain and neck stiffness.  Skin:  Negative for rash and wound.  Neurological: Positive for headaches. Negative for dizziness, facial asymmetry, speech difficulty, weakness and numbness.  Psychiatric/Behavioral: Negative for confusion and decreased concentration.  All other systems reviewed and are negative.    Physical Exam Updated Vital Signs BP 131/80 (BP Location: Left Arm)   Pulse 72   Temp 97.8 F (36.6 C) (Oral)   Resp 18   Ht  (1.803 m)   Wt 97.5 kg (215 lb)   SpO2 97%   BMI 29.99 kg/m   Physical Exam  Constitutional: He is oriented to person, place, and time. He appears well-developed and well-nourished. No distress.  HENT:  Head: Normocephalic and atraumatic.  Mouth/Throat: Oropharynx is clear and moist.  Eyes: Pupils are equal, round, and reactive to light. EOM are normal.  Neck: Normal range of motion and phonation normal. Neck supple. No spinous process tenderness and no muscular tenderness present. No neck rigidity. No Kernig's sign noted.  Cardiovascular: Normal rate, regular rhythm and intact distal pulses.   No murmur heard. Pulmonary/Chest: Effort normal and breath sounds normal. No respiratory distress. He exhibits no tenderness.  Abdominal: Soft. He exhibits no distension. There is no tenderness. There is no guarding.  Musculoskeletal: Normal range of motion.  Neurological: He is alert and oriented to person, place, and time. He has normal strength. No cranial nerve deficit or sensory deficit. He exhibits normal muscle tone. Coordination and gait normal. GCS eye subscore is 4. GCS verbal subscore is 5. GCS motor subscore is 6.  Reflex Scores:      Tricep reflexes are 2+ on the right side and 2+ on the left side.      Bicep reflexes are 2+ on the right side and 2+ on the left side. CN II-XII intact  Skin: Skin is warm and dry. Capillary refill takes less than 2 seconds.  Psychiatric: He has a normal mood and affect.  Nursing note and vitals reviewed.    ED Treatments / Results   Labs (all labs ordered are listed, but only abnormal results are displayed) Labs Reviewed - No data to display  EKG  EKG Interpretation None       Radiology No results found.  Procedures Procedures (including critical care time)  Medications Ordered in ED Medications  ketorolac (TORADOL) injection 60 mg (60 mg Intramuscular Given 05/26/17 2010)  metoCLOPramide (REGLAN) injection 10 mg (10 mg Intramuscular Given 05/26/17 2012)  diphenhydrAMINE (BENADRYL) capsule 25 mg (25 mg Oral Given 05/26/17 2010)     Initial Impression / Assessment and Plan / ED Course  I have reviewed the triage vital signs and the nursing notes.  Pertinent labs & imaging results that were available during my care of the patient were reviewed by me and considered in my medical decision making (see chart for details).     2035 on recheck, pt is feeling better.  Headache improved.  No nuchal rigidity.  No focal neuro deficits.  Ambulates with steady gait.  Return precautions discussed.  Final Clinical Impressions(s) / ED Diagnoses   Final diagnoses:  Tension headache    New Prescriptions New Prescriptions   No medications on file     Rosey Bath 05/28/17 1748    Tilden Fossa, MD 05/30/17 1218

## 2017-05-26 NOTE — ED Notes (Signed)
Pt reports headache with last OTC meds yesterday  Playing of cell phone and watching TV Non photophobic

## 2017-09-09 ENCOUNTER — Other Ambulatory Visit: Payer: Self-pay

## 2017-09-09 ENCOUNTER — Emergency Department (HOSPITAL_COMMUNITY): Payer: Managed Care, Other (non HMO)

## 2017-09-09 ENCOUNTER — Emergency Department (HOSPITAL_COMMUNITY)
Admission: EM | Admit: 2017-09-09 | Discharge: 2017-09-09 | Disposition: A | Discharge status: Home / Self Care | Payer: Managed Care, Other (non HMO) | Attending: Emergency Medicine | Admitting: Emergency Medicine

## 2017-09-09 ENCOUNTER — Encounter (HOSPITAL_COMMUNITY): Payer: Self-pay | Admitting: Emergency Medicine

## 2017-09-09 DIAGNOSIS — Z87891 Personal history of nicotine dependence: Secondary | ICD-10-CM | POA: Diagnosis not present

## 2017-09-09 DIAGNOSIS — M25562 Pain in left knee: Secondary | ICD-10-CM

## 2017-09-09 NOTE — ED Triage Notes (Signed)
PT c/o left knee pain while walking yesterday. PT ambulatory in triage with NAD noted and denies any hx of knee problems.

## 2017-09-09 NOTE — Discharge Instructions (Signed)
X-ray negative. I suspect soft tissue inflammation. Take ibuprofen and tylenol for pain. Ice. Follow up with primary care provider in 1 week if symptoms do not improve.

## 2017-09-09 NOTE — ED Provider Notes (Signed)
[facility] Provider Note   CSN: [id number] Arrival date & time: 09/09/17  1717     History   Chief Complaint Chief Complaint  Patient presents with  . Knee Pain    HPI [facility] is a 37 y.o. male presents for left knee pain since yesterday. Pain is located to medial and lateral aspect of knee, worse with weight bearing and full flexion. Not aggravated by extension or palpation. Denies trauma. 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 No previous h/o injury or trauma to knee. Denies fevers, chills, swelling, redness, warmth, numbness/tingling distally. No IVDU. Has not tired anything OTC for symptoms.   HPI  History reviewed. No pertinent past medical history.  Patient Active Problem List   Diagnosis Date Noted  . Fracture of navicular bone of foot, right, closed 04/30/2012    History reviewed. No pertinent surgical history.     Home Medications    Prior to Admission medications   Medication Sig Start Date End Date Taking? Authorizing Provider  diclofenac (VOLTAREN) 75 MG EC tablet Take 1 tablet (75 mg total) by mouth 2 (two) times daily. Take with food 10/07/16   Pauline Aus, PA-C    Family History History reviewed. No pertinent family history.  Social History Social History   Tobacco Use  . Smoking status: Former Games developer  . Smokeless tobacco: Never Used  Substance Use Topics  . Alcohol use: No  . Drug use: No     Allergies   Patient has no known allergies.   Review of Systems Review of Systems  Musculoskeletal: Positive for arthralgias and gait problem.  All other systems reviewed and are negative.    Physical Exam Updated Vital Signs BP 129/75 (BP Location: Right Arm)   Pulse 72   Temp 98.2 F (36.8 C) (Oral)   Resp 18   Ht 5\' 10"  (1.778 m)   Wt 99.8 kg (220 lb)   SpO2 99%   BMI 31.57 kg/m   Physical Exam  Constitutional: He is oriented to person, place, and  time. He appears well-developed and well-nourished. No distress.  NAD.  HENT:  Head: Normocephalic and atraumatic.  Right Ear: External ear normal.  Left Ear: External ear normal.  Nose: Nose normal.  Eyes: Conjunctivae and EOM are normal. No scleral icterus.  Neck: Normal range of motion. Neck supple.  Cardiovascular: Normal rate, regular rhythm, normal heart sounds and intact distal pulses.  No murmur heard. Pulmonary/Chest: Effort normal and breath sounds normal. He has no wheezes.  Musculoskeletal: Normal range of motion. He exhibits tenderness. He exhibits no deformity.  Mild tenderness along MCL and LCL w/o overlaying skin changes or edema. No obvious deformity of knees including edema, erythema or effusion.  Full passive ROM of knees bilaterally with normal patellar J tracking bilaterally. No medial or lateral joint line tenderness.  No bony tenderness over patella, fibular head, tibial tuberosity, patellar tendon or quadriceps tendon.    Negative Lachman's. Negative posterior drawer test.  Negative McMurray's. Negative ballottement test. No varus or valgus laxity.  No crepitus with knee ROM.  Patient ambulated to x-ray without antalgic gait.   Neurological: He is alert and oriented to person, place, and time.  Skin: Skin is warm and dry. Capillary refill takes less than 2 seconds.  Psychiatric: He has a normal mood and affect. His behavior is normal. Judgment and thought content normal.  Nursing note and vitals reviewed.  ED Treatments / Results  Labs (all labs ordered are listed, but only abnormal results are displayed) Labs Reviewed - No data to display  EKG  EKG Interpretation None       Radiology Dg Knee Complete 4 Views Left  Result Date: 09/09/2017 CLINICAL DATA:  Knee pain EXAM: LEFT KNEE - COMPLETE 4+ VIEW COMPARISON:  None. FINDINGS: No evidence of fracture, dislocation, or joint effusion. No evidence of arthropathy or other focal bone abnormality. Soft  tissues are unremarkable. IMPRESSION: Negative. Electronically Signed   By: Kennith CenterEric  Mansell M.D.   On: 09/09/2017 18:44    Procedures Procedures (including critical care time)  Medications Ordered in ED Medications - No data to display   Initial Impression / Assessment and Plan / ED Course  I have reviewed the triage vital signs and the nursing notes.  Pertinent labs & imaging results that were available during my care of the patient were reviewed by me and considered in my medical decision making (see chart for details).    37 y.o. year old male w/o pertinent pmh presents with atraumatic left knee pain.  No fevers or chills or skin changes. No h/o IVDU or immunosuppression. No h/o DVT/PE. Not a prosthetic joint. No preceding trauma, URI or GI illness. On exam, there is mild tenderness to LCL and MCL. No warmth, fluctuance or evidence of overlaying cellulitis. Only mild pain with full ROM. Extremity is NVI. Initial differential diagnosis includes septic arthritis, reactive arthritis, gout, however these don't fit clinical picture and presentation.  Higher suspicion for  soft tissue injury. No h/o high risk sexual practices, generalized rash or GU symptoms to raise suspicion for gonococcal arthritis. Will ice, elevate, treat pain and get x-ray.  Final Clinical Impressions(s) / ED Diagnoses   X-ray negative. Will d/c with conservative management. PCP f/u in 1 week for re-eval. Discussed return precautions. Pt verbalized understanding and agreeable with tx plan.  Final diagnoses:  Acute pain of left knee    ED Discharge Orders    None       Jerrell MylarGibbons, Willis Holquin J, PA-C 09/09/17 1915    Vanetta MuldersZackowski, Scott, MD 09/10/17 [phone]

## 2017-11-09 ENCOUNTER — Other Ambulatory Visit: Payer: Self-pay

## 2017-11-09 ENCOUNTER — Emergency Department (HOSPITAL_COMMUNITY)
Admission: EM | Admit: 2017-11-09 | Discharge: 2017-11-09 | Disposition: A | Discharge status: Home / Self Care | Payer: Managed Care, Other (non HMO) | Attending: Emergency Medicine | Admitting: Emergency Medicine

## 2017-11-09 ENCOUNTER — Encounter (HOSPITAL_COMMUNITY): Payer: Self-pay | Admitting: Cardiology

## 2017-11-09 DIAGNOSIS — R6889 Other general symptoms and signs: Secondary | ICD-10-CM

## 2017-11-09 DIAGNOSIS — Z87891 Personal history of nicotine dependence: Secondary | ICD-10-CM | POA: Diagnosis not present

## 2017-11-09 DIAGNOSIS — R509 Fever, unspecified: Secondary | ICD-10-CM | POA: Diagnosis present

## 2017-11-09 DIAGNOSIS — Z79899 Other long term (current) drug therapy: Secondary | ICD-10-CM | POA: Insufficient documentation

## 2017-11-09 DIAGNOSIS — J111 Influenza due to unidentified influenza virus with other respiratory manifestations: Secondary | ICD-10-CM | POA: Diagnosis not present

## 2017-11-09 LAB — RAPID STREP SCREEN (MED CTR MEBANE ONLY): Streptococcus, Group A Screen (Direct): NEGATIVE

## 2017-11-09 MED ORDER — BENZONATATE 100 MG PO CAPS: 200.0000 mg | ORAL_CAPSULE | Freq: Three times a day (TID) | ORAL | 0 refills | Status: DC | PRN

## 2017-11-09 MED ORDER — OSELTAMIVIR PHOSPHATE 75 MG PO CAPS: 75.0000 mg | ORAL_CAPSULE | Freq: Two times a day (BID) | ORAL | 0 refills | Status: DC

## 2017-11-09 MED ORDER — IBUPROFEN 800 MG PO TABS
800.0000 mg | ORAL_TABLET | Freq: Once | ORAL | Status: AC
  Administered 2017-11-09: 800 mg via ORAL
  Filled 2017-11-09: qty 1

## 2017-11-09 MED ORDER — IBUPROFEN 600 MG PO TABS: 600.0000 mg | ORAL_TABLET | Freq: Four times a day (QID) | ORAL | 0 refills | Status: DC | PRN

## 2017-11-09 MED ORDER — OSELTAMIVIR PHOSPHATE 75 MG PO CAPS
75.0000 mg | ORAL_CAPSULE | Freq: Once | ORAL | Status: AC
  Administered 2017-11-09: 75 mg via ORAL
  Filled 2017-11-09: qty 1

## 2017-11-09 NOTE — Discharge Instructions (Signed)
Your strep test is negative but your exam and symptoms are highly suspicious for the flu. Rest, drink plenty of fluids.  Take your next dose of the Tamiflu tomorrow morning.

## 2017-11-09 NOTE — ED Triage Notes (Signed)
Sore throat and cough times 3 days

## 2017-11-10 NOTE — ED Provider Notes (Signed)
[facility] EMERGENCY DEPARTMENT Provider Note   CSN: [id number] Arrival date & time: 11/09/17  1341     History   Chief Complaint No chief complaint on file.   HPI [facility] is a 37 y.o. male presenting with a 2 day history of flu like symptoms which includes nasal congestion with clear rhinorrhea, sore throat, low grade fever, myalgias and nonproductive cough.  Symptoms do not include shortness of breath, chest pain,  Nausea, vomiting or diarrhea. He endorses exposure to several coworkers who are now out with the flu.  He reports rather sudden onset of sx yesterday while at work.  The patient has taken nyquill prior to arrival with no significant improvement in symptoms. .  The history is provided by the patient.    History reviewed. No pertinent past medical history.  Patient Active Problem List   Diagnosis Date Noted  . Fracture of navicular bone of foot, right, closed 04/30/2012    History reviewed. No pertinent surgical history.     Home Medications    Prior to Admission medications   Medication Sig Start Date End Date Taking? Authorizing Provider  benzonatate (TESSALON) 100 MG capsule Take 2 capsules (200 mg total) by mouth 3 (three) times daily as needed. 11/09/17   Burgess Amor, PA-C  benzonatate (TESSALON) 100 MG capsule Take 2 capsules (200 mg total) by mouth 3 (three) times daily as needed. 11/09/17   Burgess Amor, PA-C  diclofenac (VOLTAREN) 75 MG EC tablet Take 1 tablet (75 mg total) by mouth 2 (two) times daily. Take with food 10/07/16   Triplett, Tammy, PA-C  ibuprofen (ADVIL,MOTRIN) 600 MG tablet Take 1 tablet (600 mg total) by mouth every 6 (six) hours as needed. 11/09/17   Burgess Amor, PA-C  oseltamivir (TAMIFLU) 75 MG capsule Take 1 capsule (75 mg total) by mouth every 12 (twelve) hours. 11/09/17   Burgess Amor, PA-C    Family History History reviewed. No pertinent family history.  Social History Social History   Tobacco Use  . Smoking status: Former  Games developer  . Smokeless tobacco: Never Used  Substance Use Topics  . Alcohol use: No  . Drug use: No     Allergies   Patient has no known allergies.   Review of Systems Review of Systems  Constitutional: Positive for chills and fever.  HENT: Positive for rhinorrhea and sore throat. Negative for congestion, ear pain, sinus pressure, trouble swallowing and voice change.   Eyes: Negative for discharge.  Respiratory: Positive for cough. Negative for shortness of breath, wheezing and stridor.   Cardiovascular: Negative for chest pain.  Gastrointestinal: Negative for abdominal pain, diarrhea, nausea and vomiting.  Genitourinary: Negative.   Musculoskeletal: Positive for myalgias.     Physical Exam Updated Vital Signs BP 128/84   Pulse (!) 103   Temp 99.4 F (37.4 C) (Oral)   Resp 18   Ht 5\' 11"  (1.803 m)   Wt 97.5 kg (215 lb)   SpO2 95%   BMI 29.99 kg/m   Physical Exam  Constitutional: He is oriented to person, place, and time. He appears well-developed and well-nourished.  HENT:  Head: Normocephalic and atraumatic.  Right Ear: Tympanic membrane and ear canal normal.  Left Ear: Tympanic membrane and ear canal normal.  Nose: Rhinorrhea present.  Mouth/Throat: Uvula is midline and mucous membranes are normal. Posterior oropharyngeal erythema present. No oropharyngeal exudate, posterior oropharyngeal edema or tonsillar abscesses. Tonsils are 1+ on the right. Tonsils are 1+ on the left. No  tonsillar exudate.  Eyes: Conjunctivae are normal.  Cardiovascular: Normal rate and normal heart sounds.  Pulmonary/Chest: Effort normal. No stridor. No respiratory distress. He has no decreased breath sounds. He has no wheezes. He has no rhonchi. He has no rales.  Abdominal: Soft. There is no tenderness.  Musculoskeletal: Normal range of motion.  Neurological: He is alert and oriented to person, place, and time.  Skin: Skin is warm and dry. No rash noted.  Psychiatric: He has a normal mood  and affect.     ED Treatments / Results  Labs (all labs ordered are listed, but only abnormal results are displayed) Labs Reviewed  RAPID STREP SCREEN (NOT AT [facility])  CULTURE, GROUP A STREP [facility])    EKG  EKG Interpretation None       Radiology No results found.  Procedures Procedures (including critical care time)  Medications Ordered in ED Medications  ibuprofen (ADVIL,MOTRIN) tablet 800 mg (800 mg Oral Given 11/09/17 1620)  oseltamivir (TAMIFLU) capsule 75 mg (75 mg Oral Given 11/09/17 1620)     Initial Impression / Assessment and Plan / ED Course  I have reviewed the triage vital signs and the nursing notes.  Pertinent labs & imaging results that were available during my care of the patient were reviewed by me and considered in my medical decision making (see chart for details).     Pt with hx and exam c/w with influenza with positive exposure. No acute distress, appears fatigued, but no respiratory distress, vss, borderline tachycardia. Discussed flu tx including symptomatic tx vs symptomatic tx adding tamiflu. Pt chooses the tamiflu - first dose given here.  F/u with pcp or return here for worsened sx including sob, increasing weakness or new sx.  The patient appears reasonably screened and/or stabilized for discharge and I doubt any other medical condition or other [facility] requiring further screening, evaluation, or treatment in the ED at this time prior to discharge.   Final Clinical Impressions(s) / ED Diagnoses   Final diagnoses:  Flu-like symptoms    ED Discharge Orders        Ordered    oseltamivir (TAMIFLU) 75 MG capsule  Every 12 hours     11/09/17 1610    ibuprofen (ADVIL,MOTRIN) 600 MG tablet  Every 6 hours PRN     11/09/17 1610    benzonatate (TESSALON) 100 MG capsule  3 times daily PRN     11/09/17 1610    benzonatate (TESSALON) 100 MG capsule  3 times daily PRN     11/09/17 1610       Burgess Amordol, Jamare Vanatta, PA-C 11/10/17 1608    Eber HongMiller, Brian,  MD 11/10/17 1615

## 2017-11-12 LAB — CULTURE, GROUP A STREP (THRC)

## 2017-12-25 ENCOUNTER — Emergency Department (HOSPITAL_COMMUNITY)
Admission: EM | Admit: 2017-12-25 | Discharge: 2017-12-25 | Disposition: A | Discharge status: Home / Self Care | Payer: Managed Care, Other (non HMO) | Attending: Emergency Medicine | Admitting: Emergency Medicine

## 2017-12-25 ENCOUNTER — Encounter (HOSPITAL_COMMUNITY): Payer: Self-pay | Admitting: Emergency Medicine

## 2017-12-25 ENCOUNTER — Other Ambulatory Visit: Payer: Self-pay

## 2017-12-25 DIAGNOSIS — Z87891 Personal history of nicotine dependence: Secondary | ICD-10-CM | POA: Diagnosis not present

## 2017-12-25 DIAGNOSIS — L0291 Cutaneous abscess, unspecified: Secondary | ICD-10-CM

## 2017-12-25 DIAGNOSIS — R222 Localized swelling, mass and lump, trunk: Secondary | ICD-10-CM | POA: Diagnosis present

## 2017-12-25 DIAGNOSIS — L02211 Cutaneous abscess of abdominal wall: Secondary | ICD-10-CM | POA: Insufficient documentation

## 2017-12-25 DIAGNOSIS — Z79899 Other long term (current) drug therapy: Secondary | ICD-10-CM | POA: Insufficient documentation

## 2017-12-25 MED ORDER — HYDROCODONE-ACETAMINOPHEN 5-325 MG PO TABS
1.0000 | ORAL_TABLET | Freq: Once | ORAL | Status: AC
  Administered 2017-12-25: 1 via ORAL
  Filled 2017-12-25: qty 1

## 2017-12-25 MED ORDER — LIDOCAINE-EPINEPHRINE (PF) 2 %-1:200000 IJ SOLN
20.0000 mL | Freq: Once | INTRAMUSCULAR | Status: AC
  Administered 2017-12-25: 20 mL
  Filled 2017-12-25: qty 20

## 2017-12-25 MED ORDER — CEPHALEXIN 500 MG PO CAPS: 500.0000 mg | ORAL_CAPSULE | Freq: Four times a day (QID) | ORAL | 0 refills | Status: DC

## 2017-12-25 NOTE — Discharge Instructions (Addendum)
Remove packing in 2 days.  Take antibiotics as directed.  If you develop worsening redness or recurrence, you should be reevaluated.

## 2017-12-25 NOTE — ED Notes (Signed)
Suture cart at bedside 

## 2017-12-25 NOTE — ED Triage Notes (Signed)
Pt c/o abscess to the lower abd since yesterday.

## 2017-12-25 NOTE — ED Notes (Signed)
This nurse placed gauze bandage over abscess to drain.

## 2017-12-25 NOTE — ED Provider Notes (Signed)
[facility] EMERGENCY DEPARTMENT Provider Note   CSN: [id number] Arrival date & time: 12/25/17  0121     History   Chief Complaint Chief Complaint  Patient presents with  . Abscess    HPI Michael Strong is a 37 y.o. male.  HPI  This is a 37 year old male who presents with an abscess.  Patient reports 1 day history of lower abdominal abscess.  He reports prior history of abscess.  No noted fevers at home.  Reports tenderness to palpation.  Currently rates his pain at 5 out of 10.  He has not taken anything for his symptoms.  Denies any recent fevers or systemic illness.  History reviewed. No pertinent past medical history.  Patient Active Problem List   Diagnosis Date Noted  . Fracture of navicular bone of foot, right, closed 04/30/2012    History reviewed. No pertinent surgical history.      Home Medications    Prior to Admission medications   Medication Sig Start Date End Date Taking? Authorizing Provider  benzonatate (TESSALON) 100 MG capsule Take 2 capsules (200 mg total) by mouth 3 (three) times daily as needed. 11/09/17   Burgess Amor, PA-C  benzonatate (TESSALON) 100 MG capsule Take 2 capsules (200 mg total) by mouth 3 (three) times daily as needed. 11/09/17   Idol, Raynelle Fanning, PA-C  cephALEXin (KEFLEX) 500 MG capsule Take 1 capsule (500 mg total) by mouth 4 (four) times daily. 12/25/17   Jewel Mcafee, Mayer Masker, MD  diclofenac (VOLTAREN) 75 MG EC tablet Take 1 tablet (75 mg total) by mouth 2 (two) times daily. Take with food 10/07/16   Triplett, Tammy, PA-C  ibuprofen (ADVIL,MOTRIN) 600 MG tablet Take 1 tablet (600 mg total) by mouth every 6 (six) hours as needed. 11/09/17   Burgess Amor, PA-C  oseltamivir (TAMIFLU) 75 MG capsule Take 1 capsule (75 mg total) by mouth every 12 (twelve) hours. 11/09/17   Burgess Amor, PA-C    Family History History reviewed. No pertinent family history.  Social History Social History   Tobacco Use  . Smoking status: Former Games developer  . Smokeless  tobacco: Never Used  Substance Use Topics  . Alcohol use: No  . Drug use: No     Allergies   Patient has no known allergies.   Review of Systems Review of Systems  Constitutional: Negative for fever.  Skin: Positive for color change.       Abscess  All other systems reviewed and are negative.    Physical Exam Updated Vital Signs BP (!) 146/95   Pulse 74   Temp 98.5 F (36.9 C)   Resp 18   Ht 5\' 10"  (1.778 m)   Wt 97.5 kg (215 lb)   SpO2 99%   BMI 30.85 kg/m   Physical Exam  Constitutional: He is oriented to person, place, and time. He appears well-developed and well-nourished. No distress.  HENT:  Head: Normocephalic and atraumatic.  Cardiovascular: Normal rate and regular rhythm.  Pulmonary/Chest: Effort normal. No respiratory distress.  Abdominal: Soft.  3 x 2 cm area of fluctuance over the lower mid abdomen near the suprapubic region, no significant surrounding erythema  Neurological: He is alert and oriented to person, place, and time.  Skin: Skin is warm and dry.  Psychiatric: He has a normal mood and affect.  Nursing note and vitals reviewed.    ED Treatments / Results  Labs (all labs ordered are listed, but only abnormal results are displayed) Labs Reviewed - No data  to display  EKG None  Radiology No results found.  Procedures .Marland Kitchen.Incision and Drainage Date/Time: 12/25/2017 4:40 AM Performed by: Shon BatonHorton, Likisha Alles F, MD Authorized by: Shon BatonHorton, Amareon Phung F, MD   Consent:    Consent obtained:  Verbal   Consent given by:  Patient   Risks discussed:  Bleeding, incomplete drainage and pain   Alternatives discussed:  No treatment Location:    Type:  Abscess   Size:  2x3 cm   Location:  Trunk   Trunk location:  Abdomen Pre-procedure details:    Skin preparation:  Chloraprep Anesthesia (see MAR for exact dosages):    Anesthesia method:  Local infiltration   Local anesthetic:  Lidocaine 2% WITH epi Procedure type:    Complexity:   Simple Procedure details:    Needle aspiration: no     Incision types:  Stab incision   Incision depth:  Dermal   Scalpel blade:  11   Wound management:  Probed and deloculated   Drainage:  Purulent   Drainage amount:  Moderate   Wound treatment:  Wound left open   Packing materials:  1/4 in gauze Post-procedure details:    Patient tolerance of procedure:  Tolerated well, no immediate complications   (including critical care time)  Medications Ordered in ED Medications  HYDROcodone-acetaminophen (NORCO/VICODIN) 5-325 MG per tablet 1 tablet (1 tablet Oral Given 12/25/17 0304)  lidocaine-EPINEPHrine (XYLOCAINE W/EPI) 2 %-1:200000 (PF) injection 20 mL (20 mLs Infiltration Given 12/25/17 0305)     Initial Impression / Assessment and Plan / ED Course  I have reviewed the triage vital signs and the nursing notes.  Pertinent labs & imaging results that were available during my care of the patient were reviewed by me and considered in my medical decision making (see chart for details).     Patient presents with an abscess of the lower abdominal wall.  No significant erythema.  Denies any systemic symptoms.  Vital signs are reassuring.  Patient is afebrile.  Abscess was drained.  Will place on Keflex.  Recommend follow-up in 2 days with packing removal and recheck.  After history, exam, and medical workup I feel the patient has been appropriately medically screened and is safe for discharge home. Pertinent diagnoses were discussed with the patient. Patient was given return precautions.   Final Clinical Impressions(s) / ED Diagnoses   Final diagnoses:  Abscess    ED Discharge Orders        Ordered    cephALEXin (KEFLEX) 500 MG capsule  4 times daily     12/25/17 0327       Karli Wickizer, Mayer Maskerourtney F, MD 12/25/17 [id number]

## 2018-05-22 ENCOUNTER — Encounter (HOSPITAL_BASED_OUTPATIENT_CLINIC_OR_DEPARTMENT_OTHER): Payer: Self-pay | Admitting: Emergency Medicine

## 2018-05-22 ENCOUNTER — Emergency Department (HOSPITAL_BASED_OUTPATIENT_CLINIC_OR_DEPARTMENT_OTHER)
Admission: EM | Admit: 2018-05-22 | Discharge: 2018-05-22 | Disposition: A | Discharge status: Home / Self Care | Payer: No Typology Code available for payment source | Attending: Emergency Medicine | Admitting: Emergency Medicine

## 2018-05-22 ENCOUNTER — Other Ambulatory Visit: Payer: Self-pay

## 2018-05-22 ENCOUNTER — Emergency Department (HOSPITAL_BASED_OUTPATIENT_CLINIC_OR_DEPARTMENT_OTHER): Payer: No Typology Code available for payment source

## 2018-05-22 DIAGNOSIS — S300XXA Contusion of lower back and pelvis, initial encounter: Secondary | ICD-10-CM | POA: Diagnosis not present

## 2018-05-22 DIAGNOSIS — T148XXA Other injury of unspecified body region, initial encounter: Secondary | ICD-10-CM

## 2018-05-22 DIAGNOSIS — S3992XA Unspecified injury of lower back, initial encounter: Secondary | ICD-10-CM | POA: Diagnosis present

## 2018-05-22 DIAGNOSIS — Z87891 Personal history of nicotine dependence: Secondary | ICD-10-CM | POA: Insufficient documentation

## 2018-05-22 DIAGNOSIS — W0110XA Fall on same level from slipping, tripping and stumbling with subsequent striking against unspecified object, initial encounter: Secondary | ICD-10-CM | POA: Diagnosis not present

## 2018-05-22 DIAGNOSIS — M545 Low back pain, unspecified: Secondary | ICD-10-CM

## 2018-05-22 DIAGNOSIS — Y929 Unspecified place or not applicable: Secondary | ICD-10-CM | POA: Diagnosis not present

## 2018-05-22 DIAGNOSIS — Y990 Civilian activity done for income or pay: Secondary | ICD-10-CM | POA: Diagnosis not present

## 2018-05-22 DIAGNOSIS — Y9301 Activity, walking, marching and hiking: Secondary | ICD-10-CM | POA: Diagnosis not present

## 2018-05-22 NOTE — ED Triage Notes (Signed)
Pt states he does not need a post accident drug screen

## 2018-05-22 NOTE — ED Provider Notes (Signed)
TIME SEEN: 12:51 AM  CHIEF COMPLAINT: Fall  HPI: Patient is a 37 year old male with no significant past medical history who presents to the emergency department after he slipped and fell around 10 PM at work last night.  States that he hit his lower back on a metal rail.  He is having pain and swelling in this area.  Not on antiplatelets or anticoagulants.  No numbness, tingling, focal weakness, bowel or bladder incontinence.  No head injury.  ROS: See HPI Constitutional: no fever  Eyes: no drainage  ENT: no runny nose   Cardiovascular:  no chest pain  Resp: no SOB  GI: no vomiting GU: no dysuria Integumentary: no rash  Allergy: no hives  Musculoskeletal: no leg swelling  Neurological: no slurred speech ROS otherwise negative  PAST MEDICAL HISTORY/PAST SURGICAL HISTORY:  History reviewed. No pertinent past medical history.  MEDICATIONS:  Prior to Admission medications   Not on File    ALLERGIES:  No Known Allergies  SOCIAL HISTORY:  Social History   Tobacco Use  . Smoking status: Former Games developermoker  . Smokeless tobacco: Never Used  Substance Use Topics  . Alcohol use: No    FAMILY HISTORY: No family history on file.  EXAM: BP (!) 139/95 (BP Location: Left Arm)   Pulse 62   Temp 98.1 F (36.7 C) (Oral)   Resp 16   Ht 5\' 11"  (1.803 m)   Wt 97.5 kg   SpO2 99%   BMI 29.99 kg/m  CONSTITUTIONAL: Alert and oriented and responds appropriately to questions. Well-appearing; well-nourished; GCS 15 HEAD: Normocephalic; atraumatic EYES: Conjunctivae clear, PERRL, EOMI ENT: normal nose; no rhinorrhea; moist mucous membranes; pharynx without lesions noted; no dental injury; no septal hematoma NECK: Supple, no meningismus, no LAD; no midline spinal tenderness, step-off or deformity; trachea midline CARD: RRR; S1 and S2 appreciated; no murmurs, no clicks, no rubs, no gallops RESP: Normal chest excursion without splinting or tachypnea; breath sounds clear and equal bilaterally;  no wheezes, no rhonchi, no rales; no hypoxia or respiratory distress CHEST:  chest wall stable, no crepitus or ecchymosis or deformity, nontender to palpation; no flail chest ABD/GI: Normal bowel sounds; non-distended; soft, non-tender, no rebound, no guarding; no ecchymosis or other lesions noted PELVIS:  stable, nontender to palpation BACK:  The back appears normal and is tender over the lower lumbar spine over the midline area with some midline lower lumbar spinal tenderness but no step-off or deformity noted.  He does have a large hematoma midline over his lower lumbar area.  No thoracic tenderness on examination. EXT: Normal ROM in all joints; non-tender to palpation; no edema; normal capillary refill; no cyanosis, no bony tenderness or bony deformity of patient's extremities, no joint effusion, compartments are soft, extremities are warm and well-perfused, no ecchymosis SKIN: Normal color for age and race; warm NEURO: Moves all extremities equally normal sensation diffusely, no saddle anesthesia, normal gait PSYCH: The patient's mood and manner are appropriate. Grooming and personal hygiene are appropriate.  MEDICAL DECISION MAKING: Patient here with mechanical fall at work.  Has a large hematoma over the lumbar spine.  Will obtain x-rays.  He declines pain medicine at this time.  He is neurologically intact.  ED PROGRESS: X-rays show no fracture.  Recommended alternating Tylenol and Motrin for pain.  Recommended ice to the back.  I feel he is safe to go back to work without restrictions.   At this time, I do not feel there is any life-threatening condition present.  I have reviewed and discussed all results (EKG, imaging, lab, urine as appropriate) and exam findings with patient/family. I have reviewed nursing notes and appropriate previous records.  I feel the patient is safe to be discharged home without further emergent workup and can continue workup as an outpatient as needed. Discussed  usual and customary return precautions. Patient/family verbalize understanding and are comfortable with this plan.  Outpatient follow-up has been provided if needed. All questions have been answered.      Carles Florea, Layla Maw, DO 05/22/18 [phone]

## 2018-05-22 NOTE — Discharge Instructions (Addendum)
You may alternate Tylenol 1000 mg every 6 hours as needed for pain and Ibuprofen 800 mg every 8 hours as needed for pain.  Please take Ibuprofen with food.   Your x-ray showed no fracture today.   I recommend that you apply ice for 15 to 20 minutes at a time to your lower back 3-4 times a day to help with pain and swelling.

## 2018-05-22 NOTE — ED Triage Notes (Signed)
Pt fell at work and hit his back on a metal rail

## 2018-11-04 ENCOUNTER — Emergency Department (HOSPITAL_COMMUNITY)
Admission: EM | Admit: 2018-11-04 | Discharge: 2018-11-04 | Disposition: A | Discharge status: Home / Self Care | Payer: Managed Care, Other (non HMO) | Attending: Emergency Medicine | Admitting: Emergency Medicine

## 2018-11-04 ENCOUNTER — Emergency Department (HOSPITAL_COMMUNITY): Payer: Managed Care, Other (non HMO)

## 2018-11-04 ENCOUNTER — Encounter (HOSPITAL_COMMUNITY): Payer: Self-pay | Admitting: Emergency Medicine

## 2018-11-04 DIAGNOSIS — M25561 Pain in right knee: Secondary | ICD-10-CM | POA: Insufficient documentation

## 2018-11-04 DIAGNOSIS — M899 Disorder of bone, unspecified: Secondary | ICD-10-CM | POA: Diagnosis not present

## 2018-11-04 DIAGNOSIS — Z87891 Personal history of nicotine dependence: Secondary | ICD-10-CM | POA: Diagnosis not present

## 2018-11-04 MED ORDER — IBUPROFEN 800 MG PO TABS: 800.0000 mg | ORAL_TABLET | Freq: Three times a day (TID) | ORAL | 0 refills | Status: DC

## 2018-11-04 NOTE — ED Provider Notes (Signed)
[facility] EMERGENCY DEPARTMENT Provider Note   CSN: [id number] Arrival date & time: 11/04/18  5631    History   Chief Complaint Chief Complaint  Patient presents with  . Knee Pain    HPI Michael Strong is a 38 y.o. male.     HPI   Michael Strong is a 38 y.o. male who presents to the Emergency Department complaining of right knee pain for 4 days.  He describes an "aching" the front and side of his right knee with some clicking with walking up and down steps.  At times, he states his knee feels like it is going to "give way" he denies known injury or fall.  He has not tried any medications or therapies prior to arrival.  No previous right knee pain.  He denies redness, swelling, increased warmth, or pain radiating proximal or distal to the knee.    History reviewed. No pertinent past medical history.  Patient Active Problem List   Diagnosis Date Noted  . Fracture of navicular bone of foot, right, closed 04/30/2012    History reviewed. No pertinent surgical history.    Home Medications    Prior to Admission medications   Not on File    Family History History reviewed. No pertinent family history.  Social History Social History   Tobacco Use  . Smoking status: Former Games developer  . Smokeless tobacco: Never Used  Substance Use Topics  . Alcohol use: No  . Drug use: No     Allergies   Patient has no known allergies.   Review of Systems Review of Systems  Constitutional: Negative for chills and fever.  Musculoskeletal: Positive for arthralgias (right knee pain) and joint swelling.  Skin: Negative for color change and wound.  Neurological: Negative for weakness and numbness.     Physical Exam Updated Vital Signs BP (!) 130/92   Pulse 72   Temp 97.8 F (36.6 C) (Oral)   Resp 18   Ht 5\' 9"  (1.753 m)   Wt 97.5 kg   SpO2 100%   BMI 31.75 kg/m   Physical Exam Vitals signs and nursing note reviewed.  Constitutional:      General: He is not in  acute distress.    Appearance: He is well-developed.  Cardiovascular:     Rate and Rhythm: Normal rate and regular rhythm.     Pulses: Normal pulses.  Pulmonary:     Effort: Pulmonary effort is normal.     Breath sounds: Normal breath sounds.  Musculoskeletal:        General: Tenderness present. No swelling or signs of injury.     Right lower leg: No edema.     Left lower leg: No edema.     Comments: ttp of the anterolateral right knee.  Mild patellar crepitus and clicking on valgus stress.  No erythema, effusion, or step-off deformity.    Skin:    General: Skin is warm and dry.     Capillary Refill: Capillary refill takes less than 2 seconds.     Findings: No erythema.  Neurological:     General: No focal deficit present.     Mental Status: He is alert.     Sensory: No sensory deficit.     Motor: No weakness or abnormal muscle tone.      ED Treatments / Results  Labs (all labs ordered are listed, but only abnormal results are displayed) Labs Reviewed - No data to display  EKG None  Radiology  Dg Knee Complete 4 Views Right  Result Date: 11/04/2018 CLINICAL DATA:  38 year old male with right lateral knee pain for the past few days. No known injury. EXAM: RIGHT KNEE - COMPLETE 4+ VIEW COMPARISON:  None. FINDINGS: No evidence of acute fracture, malalignment or knee joint effusion. No significant degenerative changes. There is a subtle lucency with a thin sclerotic margin and narrow zone of transition within the proximal metaphysis of the fibular head. The structure measures 2.1 x 1.0 cm. No evidence of periosteal reaction, bony expansion or other aggressive features. The visualized soft tissues are unremarkable. IMPRESSION: 1. No acute abnormality. 2. Incidental note is made of a small 2.1 x 1.0 cm benign-appearing lesion within the proximal fibular metaphysis. Differential considerations include sequelae of a remote non-ossifying fibroma versus another benign fibro-osseous process.  Electronically Signed   By: Malachy Moan M.D.   On: 11/04/2018 09:12    Procedures Procedures (including critical care time)  Medications Ordered in ED Medications - No data to display   Initial Impression / Assessment and Plan / ED Course  I have reviewed the triage vital signs and the nursing notes.  Pertinent labs & imaging results that were available during my care of the patient were reviewed by me and considered in my medical decision making (see chart for details).        Pt with pain to the right knee w/o known injury.  NV intact.  No concerning sx's for septic joint.  XR neg for bony injury, does show likely benign appearing lesion the bone.  This was discussed with the patient and the importance of orthopedic follow-up.  He verbalized understanding and agrees to plan. Knee sleeve applied for support and prescription for NSAID.  Final Clinical Impressions(s) / ED Diagnoses   Final diagnoses:  Acute pain of right knee  Bone lesion    ED Discharge Orders    None       Pauline Aus, PA-C 11/04/18 [id number]    Blane Ohara, MD 11/05/18 [phone]

## 2018-11-04 NOTE — Discharge Instructions (Addendum)
Wear the knee sleeve as needed for weightbearing, you may remove at bedtime and for bathing.  Apply ice packs on and off to your knee.  Call 1 of the orthopedic providers listed to arrange a follow-up appointment

## 2018-11-04 NOTE — ED Triage Notes (Signed)
Pt states he has been having right knee pain with no injury for the past 3-4 days.  No swelling and is able to ambulate without difficulty.

## 2019-04-23 ENCOUNTER — Other Ambulatory Visit: Payer: Self-pay

## 2019-04-23 ENCOUNTER — Emergency Department (HOSPITAL_COMMUNITY)
Admission: EM | Admit: 2019-04-23 | Discharge: 2019-04-23 | Disposition: A | Discharge status: Home / Self Care | Payer: Managed Care, Other (non HMO) | Attending: Emergency Medicine | Admitting: Emergency Medicine

## 2019-04-23 DIAGNOSIS — X500XXA Overexertion from strenuous movement or load, initial encounter: Secondary | ICD-10-CM | POA: Insufficient documentation

## 2019-04-23 DIAGNOSIS — S39012A Strain of muscle, fascia and tendon of lower back, initial encounter: Secondary | ICD-10-CM | POA: Diagnosis not present

## 2019-04-23 DIAGNOSIS — Z87891 Personal history of nicotine dependence: Secondary | ICD-10-CM | POA: Insufficient documentation

## 2019-04-23 DIAGNOSIS — Y990 Civilian activity done for income or pay: Secondary | ICD-10-CM | POA: Insufficient documentation

## 2019-04-23 DIAGNOSIS — Y929 Unspecified place or not applicable: Secondary | ICD-10-CM | POA: Insufficient documentation

## 2019-04-23 DIAGNOSIS — Y939 Activity, unspecified: Secondary | ICD-10-CM | POA: Diagnosis not present

## 2019-04-23 DIAGNOSIS — S3992XA Unspecified injury of lower back, initial encounter: Secondary | ICD-10-CM | POA: Diagnosis present

## 2019-04-23 MED ORDER — KETOROLAC TROMETHAMINE 60 MG/2ML IM SOLN
30.0000 mg | Freq: Once | INTRAMUSCULAR | Status: AC
  Administered 2019-04-23: 30 mg via INTRAMUSCULAR
  Filled 2019-04-23: qty 2

## 2019-04-23 MED ORDER — LIDOCAINE 5 % EX PTCH
1.0000 | MEDICATED_PATCH | CUTANEOUS | Status: DC
  Administered 2019-04-23: 1 via TRANSDERMAL
  Filled 2019-04-23: qty 1

## 2019-04-23 MED ORDER — CYCLOBENZAPRINE HCL 10 MG PO TABS: 10.0000 mg | ORAL_TABLET | Freq: Two times a day (BID) | ORAL | 0 refills | Status: DC | PRN

## 2019-04-23 MED ORDER — LIDO-CAPSAICIN-MEN-METHYL SAL 0.5-0.035-5-20 % EX PTCH: 1.0000 | MEDICATED_PATCH | Freq: Every day | CUTANEOUS | 0 refills | Status: DC | PRN

## 2019-04-23 MED ORDER — METHOCARBAMOL 500 MG PO TABS
500.0000 mg | ORAL_TABLET | Freq: Once | ORAL | Status: AC
  Administered 2019-04-23: 500 mg via ORAL
  Filled 2019-04-23: qty 1

## 2019-04-23 NOTE — ED Provider Notes (Signed)
[facility] EMERGENCY DEPARTMENT Provider Note   CSN: [id number] Arrival date & time: 04/23/19  0350     History   Chief Complaint Chief Complaint  Patient presents with  . Back Pain    HPI Michael Strong is a 38 y.o. male.      Back Pain Pain location: left lower back. Radiates to:  Does not radiate Pain severity:  Mild Pain is:  Worse during the night Onset quality:  Gradual Duration:  1 day Timing:  Constant Progression:  Worsening Chronicity:  New Context: occupational injury   Relieved by:  None tried Worsened by:  Nothing Ineffective treatments:  None tried Associated symptoms: no abdominal pain, no dysuria, no fever and no weakness     No past medical history on file.  Patient Active Problem List   Diagnosis Date Noted  . Fracture of navicular bone of foot, right, closed 04/30/2012    No past surgical history on file.      Home Medications    Prior to Admission medications   Medication Sig Start Date End Date Taking? Authorizing Provider  cyclobenzaprine (FLEXERIL) 10 MG tablet Take 1 tablet (10 mg total) by mouth 2 (two) times daily as needed for muscle spasms. 04/23/19   Tabria Steines, Corene Cornea, MD  ibuprofen (ADVIL,MOTRIN) 800 MG tablet Take 1 tablet (800 mg total) by mouth 3 (three) times daily. 11/04/18   Triplett, Tammy, PA-C  Lido-Capsaicin-Men-Methyl Sal 0.5-0.035-5-20 % PTCH Apply 1 patch topically daily as needed. 04/23/19   Mattheu Brodersen, Corene Cornea, MD    Family History No family history on file.  Social History Social History   Tobacco Use  . Smoking status: Former Research scientist (life sciences)  . Smokeless tobacco: Never Used  Substance Use Topics  . Alcohol use: No  . Drug use: No     Allergies   Patient has no known allergies.   Review of Systems Review of Systems  Constitutional: Negative for chills and fever.  Gastrointestinal: Negative for abdominal pain, nausea and vomiting.  Genitourinary: Negative for decreased urine volume, difficulty urinating,  dysuria and flank pain.  Musculoskeletal: Positive for back pain and myalgias. Negative for gait problem and neck pain.  Neurological: Negative for weakness.  All other systems reviewed and are negative.    Physical Exam Updated Vital Signs BP (!) 137/97   Pulse 86   Temp 98.1 F (36.7 C) (Oral)   Resp 16   Ht 6' (1.829 m)   Wt 97.5 kg   SpO2 99%   BMI 29.15 kg/m   Physical Exam Vitals signs and nursing note reviewed.  Constitutional:      Appearance: He is well-developed.  HENT:     Head: Normocephalic and atraumatic.     Nose: No congestion or rhinorrhea.     Mouth/Throat:     Pharynx: No oropharyngeal exudate or posterior oropharyngeal erythema.  Eyes:     Extraocular Movements: Extraocular movements intact.     Conjunctiva/sclera: Conjunctivae normal.  Neck:     Musculoskeletal: Normal range of motion.  Cardiovascular:     Rate and Rhythm: Normal rate.  Pulmonary:     Effort: Pulmonary effort is normal. No respiratory distress.  Abdominal:     General: There is no distension.     Tenderness: There is no abdominal tenderness.  Musculoskeletal: Normal range of motion.        General: Tenderness (left lower back with a palpable muscle spasm) present. No swelling.  Skin:    General: Skin is warm and  dry.     Coloration: Skin is not jaundiced or pale.  Neurological:     General: No focal deficit present.     Mental Status: He is alert.      ED Treatments / Results  Labs (all labs ordered are listed, but only abnormal results are displayed) Labs Reviewed - No data to display  EKG None  Radiology No results found.  Procedures Procedures (including critical care time)  Medications Ordered in ED Medications  lidocaine (LIDODERM) 5 % 1 patch (has no administration in time range)  methocarbamol (ROBAXIN) tablet 500 mg (has no administration in time range)  ketorolac (TORADOL) injection 30 mg (has no administration in time range)     Initial  Impression / Assessment and Plan / ED Course  I have reviewed the triage vital signs and the nursing notes.  Pertinent labs & imaging results that were available during my care of the patient were reviewed by me and considered in my medical decision making (see chart for details).  Muscular back pain. No red flags to indicate need for imaging or further workup. Symptomatic care at home.   Final Clinical Impressions(s) / ED Diagnoses   Final diagnoses:  Strain of lumbar region, initial encounter    ED Discharge Orders         Ordered    Lido-Capsaicin-Men-Methyl Sal 0.5-0.035-5-20 % PTCH  Daily PRN     04/23/19 0431    cyclobenzaprine (FLEXERIL) 10 MG tablet  2 times daily PRN     04/23/19 0431           Libero Puthoff, Barbara CowerJason, MD 04/23/19 [phone]

## 2019-04-23 NOTE — ED Triage Notes (Signed)
Pt c/o lower back pain since yesterday morning. Michela Pitcher he was doing heavy lifting at work. Denies numbness tingling loss of urine or bowel.

## 2019-07-25 ENCOUNTER — Emergency Department (HOSPITAL_COMMUNITY): Payer: Managed Care, Other (non HMO)

## 2019-07-25 ENCOUNTER — Encounter (HOSPITAL_COMMUNITY): Payer: Self-pay | Admitting: Emergency Medicine

## 2019-07-25 ENCOUNTER — Emergency Department (HOSPITAL_COMMUNITY)
Admission: EM | Admit: 2019-07-25 | Discharge: 2019-07-25 | Disposition: A | Discharge status: Home / Self Care | Payer: Managed Care, Other (non HMO) | Attending: Emergency Medicine | Admitting: Emergency Medicine

## 2019-07-25 ENCOUNTER — Other Ambulatory Visit: Payer: Self-pay

## 2019-07-25 DIAGNOSIS — M545 Low back pain, unspecified: Secondary | ICD-10-CM

## 2019-07-25 DIAGNOSIS — Z87891 Personal history of nicotine dependence: Secondary | ICD-10-CM | POA: Diagnosis not present

## 2019-07-25 MED ORDER — METHOCARBAMOL 500 MG PO TABS: 500.0000 mg | ORAL_TABLET | Freq: Three times a day (TID) | ORAL | 0 refills | Status: DC

## 2019-07-25 MED ORDER — TRAMADOL HCL 50 MG PO TABS: 50.0000 mg | ORAL_TABLET | Freq: Four times a day (QID) | ORAL | 0 refills | Status: DC | PRN

## 2019-07-25 MED ORDER — IBUPROFEN 800 MG PO TABS: 800.0000 mg | ORAL_TABLET | Freq: Three times a day (TID) | ORAL | 0 refills | Status: DC

## 2019-07-25 NOTE — ED Triage Notes (Signed)
Pt c/o left sided lower back pain. Hx of same. Says that it just started hurting on his way home from work tonight.

## 2019-07-25 NOTE — Discharge Instructions (Signed)
Alternate ice and heat to your lower back.  Take medication as directed.  Avoid bending or twisting movements when possible.  Follow-up with your primary doctor for recheck if not improving.

## 2019-07-27 NOTE — ED Provider Notes (Signed)
[facility] EMERGENCY DEPARTMENT Provider Note   CSN: [id number] Arrival date & time: 07/25/19  1949     History   Chief Complaint Chief Complaint  Patient presents with  . Back Pain    HPI Michael Strong is a 38 y.o. male.     HPI   Michael Strong is a 38 y.o. male who presents to the Emergency Department complaining of left sided low back pain.  He reports having gradually worsening pain since heavy lifting at his job.  Reports hx of same.  No recent fall.  He describes the pain as aching that worsens with certain movements and improves at rest.  He denies abdominal pain, pain, numbness or weakness into his legs, urine or bowel changes, fever or chills.     History reviewed. No pertinent past medical history.  Patient Active Problem List   Diagnosis Date Noted  . Fracture of navicular bone of foot, right, closed 04/30/2012    History reviewed. No pertinent surgical history.      Home Medications    Prior to Admission medications   Medication Sig Start Date End Date Taking? Authorizing Provider  ibuprofen (ADVIL) 800 MG tablet Take 1 tablet (800 mg total) by mouth 3 (three) times daily. 07/25/19   Krina Mraz, PA-C  Lido-Capsaicin-Men-Methyl Sal 0.5-0.035-5-20 % PTCH Apply 1 patch topically daily as needed. 04/23/19   Mesner, Corene Cornea, MD  methocarbamol (ROBAXIN) 500 MG tablet Take 1 tablet (500 mg total) by mouth 3 (three) times daily. 07/25/19   Jameel Quant, PA-C  traMADol (ULTRAM) 50 MG tablet Take 1 tablet (50 mg total) by mouth every 6 (six) hours as needed. 07/25/19   Shafiq Larch, Lynelle Smoke, PA-C    Family History No family history on file.  Social History Social History   Tobacco Use  . Smoking status: Former Research scientist (life sciences)  . Smokeless tobacco: Never Used  Substance Use Topics  . Alcohol use: No  . Drug use: No     Allergies   Patient has no known allergies.   Review of Systems Review of Systems  Constitutional: Negative for fever.   Respiratory: Negative for shortness of breath.   Gastrointestinal: Negative for abdominal pain, constipation and vomiting.  Genitourinary: Negative for decreased urine volume, difficulty urinating, dysuria, flank pain and hematuria.  Musculoskeletal: Positive for back pain. Negative for joint swelling.  Skin: Negative for rash.  Neurological: Negative for weakness and numbness.     Physical Exam Updated Vital Signs BP 139/78 (BP Location: Right Arm)   Pulse 89   Temp 98 F (36.7 C) (Oral)   Resp 15   Ht 5\' 11"  (1.803 m)   Wt 97.5 kg   SpO2 98%   BMI 29.99 kg/m   Physical Exam Vitals signs and nursing note reviewed.  Constitutional:      General: He is not in acute distress.    Appearance: Normal appearance. He is well-developed.  HENT:     Head: Normocephalic and atraumatic.  Neck:     Musculoskeletal: Normal range of motion and neck supple.  Cardiovascular:     Rate and Rhythm: Normal rate and regular rhythm.     Pulses: Normal pulses.     Comments: DP pulses are strong and palpable bilaterally Pulmonary:     Effort: Pulmonary effort is normal. No respiratory distress.     Breath sounds: Normal breath sounds.  Abdominal:     General: There is no distension.     Palpations: Abdomen is soft.  Tenderness: There is no abdominal tenderness.  Musculoskeletal:        General: Tenderness present. No swelling.     Lumbar back: He exhibits tenderness and pain. He exhibits normal range of motion, no swelling, no deformity, no laceration and normal pulse.     Comments: ttp of the left lumbar paraspinal muscles.  No spinal tenderness.  Pt has 5/5 strength against resistance of bilateral lower extremities.     Skin:    General: Skin is warm.     Capillary Refill: Capillary refill takes less than 2 seconds.     Findings: No rash.  Neurological:     Mental Status: He is alert and oriented to person, place, and time.     Sensory: No sensory deficit.     Motor: No abnormal  muscle tone.     Coordination: Coordination normal.     Gait: Gait normal.     Deep Tendon Reflexes:     Reflex Scores:      Patellar reflexes are 2+ on the right side and 2+ on the left side.      Achilles reflexes are 2+ on the right side and 2+ on the left side.     ED Treatments / Results  Labs (all labs ordered are listed, but only abnormal results are displayed) Labs Reviewed - No data to display  EKG None  Radiology Dg Lumbar Spine Complete  Result Date: 07/25/2019 CLINICAL DATA:  Lumbosacral back pain. EXAM: LUMBAR SPINE - COMPLETE 4+ VIEW COMPARISON:  Radiograph 05/22/2018 FINDINGS: The alignment is maintained. Vertebral body heights are normal. There is no listhesis. The posterior elements are intact. Slight L5-S1 disc space narrowing. Disc spaces otherwise preserved. No fracture. Sacroiliac joints are symmetric and normal. IMPRESSION: Slight L5-S1 disc space narrowing. Otherwise unremarkable radiographs of the lumbar spine. Electronically Signed   By: Narda Rutherford M.D.   On: 07/25/2019 22:24    Procedures Procedures (including critical care time)  Medications Ordered in ED Medications - No data to display   Initial Impression / Assessment and Plan / ED Course  I have reviewed the triage vital signs and the nursing notes.  Pertinent labs & imaging results that were available during my care of the patient were reviewed by me and considered in my medical decision making (see chart for details).        Pt with recurrent left sided low back pain.  Likely musculoskeletal.  No focal neuro deficits, no concerning sx's for cauda equina.  Pt appears appropriate for d/c home, return precautions discussed  Final Clinical Impressions(s) / ED Diagnoses   Final diagnoses:  Acute left-sided low back pain without sciatica    ED Discharge Orders         Ordered    methocarbamol (ROBAXIN) 500 MG tablet  3 times daily     07/25/19 2336    ibuprofen (ADVIL) 800 MG  tablet  3 times daily     07/25/19 2336    traMADol (ULTRAM) 50 MG tablet  Every 6 hours PRN     07/25/19 2336           Pauline Aus, PA-C 07/27/19 1509    Mancel Bale, MD 07/28/19 [phone]

## 2019-07-30 ENCOUNTER — Encounter (HOSPITAL_COMMUNITY): Payer: Self-pay | Admitting: Emergency Medicine

## 2019-07-30 ENCOUNTER — Other Ambulatory Visit: Payer: Self-pay

## 2019-07-30 DIAGNOSIS — N342 Other urethritis: Secondary | ICD-10-CM | POA: Diagnosis not present

## 2019-07-30 DIAGNOSIS — Z87891 Personal history of nicotine dependence: Secondary | ICD-10-CM | POA: Diagnosis not present

## 2019-07-30 DIAGNOSIS — R300 Dysuria: Secondary | ICD-10-CM | POA: Diagnosis present

## 2019-07-30 LAB — URINALYSIS, ROUTINE W REFLEX MICROSCOPIC
Bacteria, UA: NONE SEEN
Bilirubin Urine: NEGATIVE
Glucose, UA: NEGATIVE mg/dL
Ketones, ur: NEGATIVE mg/dL
Leukocytes,Ua: NEGATIVE
Nitrite: NEGATIVE
Protein, ur: NEGATIVE mg/dL
Specific Gravity, Urine: 1.028 (ref 1.005–1.030)
pH: 6 (ref 5.0–8.0)

## 2019-07-30 NOTE — ED Triage Notes (Signed)
Patient states urinary tract symptoms. Patient states painful urination and burning that started today. Patient states that he has had these symptoms with a past urinary tract infection.

## 2019-07-31 ENCOUNTER — Other Ambulatory Visit: Payer: Self-pay

## 2019-07-31 ENCOUNTER — Emergency Department (HOSPITAL_COMMUNITY)
Admission: EM | Admit: 2019-07-31 | Discharge: 2019-07-31 | Disposition: A | Discharge status: Home / Self Care | Payer: Managed Care, Other (non HMO) | Attending: Emergency Medicine | Admitting: Emergency Medicine

## 2019-07-31 DIAGNOSIS — N342 Other urethritis: Secondary | ICD-10-CM

## 2019-07-31 NOTE — ED Provider Notes (Signed)
[facility] EMERGENCY DEPARTMENT Provider Note   CSN: [id number] Arrival date & time: 07/30/19  2203     History   Chief Complaint Chief Complaint  Patient presents with  . Urinary Tract Infection    symptoms    HPI Michael Strong is a 38 y.o. male.     The history is provided by the patient.  Urinary Tract Infection Presenting symptoms: dysuria   Presenting symptoms: no penile discharge, no penile pain, no scrotal pain and no swelling   Context: after urination   Relieved by:  Nothing Worsened by:  Nothing Associated symptoms: no fever     Patient reports dysuria for over 1 day No fevers or vomiting.  Reports mild back pain.  No penile discharge.  Denies any recent unprotected sexual activity  Patient Active Problem List   Diagnosis Date Noted  . Fracture of navicular bone of foot, right, closed 04/30/2012    History reviewed. No pertinent surgical history.      Home Medications    Prior to Admission medications   Medication Sig Start Date End Date Taking? Authorizing Provider  ibuprofen (ADVIL) 800 MG tablet Take 1 tablet (800 mg total) by mouth 3 (three) times daily. 07/25/19   Triplett, Tammy, PA-C  Lido-Capsaicin-Men-Methyl Sal 0.5-0.035-5-20 % PTCH Apply 1 patch topically daily as needed. 04/23/19   Mesner, Barbara Cower, MD  methocarbamol (ROBAXIN) 500 MG tablet Take 1 tablet (500 mg total) by mouth 3 (three) times daily. 07/25/19   Triplett, Tammy, PA-C  traMADol (ULTRAM) 50 MG tablet Take 1 tablet (50 mg total) by mouth every 6 (six) hours as needed. 07/25/19   Pauline Aus, PA-C    Family History History reviewed. No pertinent family history.  Social History Social History   Tobacco Use  . Smoking status: Former Games developer  . Smokeless tobacco: Never Used  Substance Use Topics  . Alcohol use: No  . Drug use: No     Allergies   Patient has no known allergies.   Review of Systems Review of Systems  Constitutional: Negative for fever.   Genitourinary: Positive for dysuria. Negative for discharge and penile pain.     Physical Exam Updated Vital Signs BP 140/87 (BP Location: Right Arm)   Pulse 92   Temp 98.7 F (37.1 C) (Oral)   Resp 18   Ht 1.803 m (5\' 11" )   Wt 97.5 kg   SpO2 100%   BMI 29.99 kg/m   Physical Exam CONSTITUTIONAL: Well developed/well nourished HEAD: Normocephalic/atraumatic EYES: EOMI ENMT: Mucous membranes moist NECK: supple no meningeal signs LUNGS:  no apparent distress ABDOMEN: soft GU: No penile discharge, no penile lesions, no scrotal tenderness, no testicular tenderness, no chaperone present NEURO: Pt is awake/alert/appropriate, moves all extremitiesx4.  No facial droop.   EXTREMITIES:  full ROM SKIN: warm, color normal PSYCH: no abnormalities of mood noted, alert and oriented to situation   ED Treatments / Results  Labs (all labs ordered are listed, but only abnormal results are displayed) Labs Reviewed  URINALYSIS, ROUTINE W REFLEX MICROSCOPIC - Abnormal; Notable for the following components:      Result Value   Hgb urine dipstick MODERATE (*)    All other components within normal limits  URINE CULTURE  GC/CHLAMYDIA PROBE AMP ([facility]) NOT AT [facility]    EKG None  Radiology No results found.  Procedures Procedures   Medications Ordered in ED Medications - No data to display   Initial Impression / Assessment and Plan / ED  Course  I have reviewed the triage vital signs and the nursing notes.  Pertinent labs results that were available during my care of the patient were reviewed by me and considered in my medical decision making (see chart for details).        Urinalysis shows no signs of UTI, GC chlamydia testing sent.  Advised patient to abstain from sexual activity until all test results  Final Clinical Impressions(s) / ED Diagnoses   Final diagnoses:  Urethritis    ED Discharge Orders    None       Ripley Fraise, MD 07/31/19 [phone]

## 2019-08-01 LAB — URINE CULTURE: Culture: NO GROWTH

## 2019-08-04 LAB — GC/CHLAMYDIA PROBE AMP (~~LOC~~) NOT AT ARMC
Chlamydia: NEGATIVE
Neisseria Gonorrhea: NEGATIVE

## 2019-09-05 ENCOUNTER — Emergency Department (HOSPITAL_COMMUNITY)
Admission: EM | Admit: 2019-09-05 | Discharge: 2019-09-05 | Disposition: A | Discharge status: Home / Self Care | Payer: Managed Care, Other (non HMO) | Attending: Emergency Medicine | Admitting: Emergency Medicine

## 2019-09-05 ENCOUNTER — Other Ambulatory Visit: Payer: Self-pay

## 2019-09-05 ENCOUNTER — Encounter (HOSPITAL_COMMUNITY): Payer: Self-pay | Admitting: Student

## 2019-09-05 DIAGNOSIS — Z79899 Other long term (current) drug therapy: Secondary | ICD-10-CM | POA: Insufficient documentation

## 2019-09-05 DIAGNOSIS — M542 Cervicalgia: Secondary | ICD-10-CM | POA: Insufficient documentation

## 2019-09-05 DIAGNOSIS — Z87891 Personal history of nicotine dependence: Secondary | ICD-10-CM | POA: Diagnosis not present

## 2019-09-05 DIAGNOSIS — Z791 Long term (current) use of non-steroidal anti-inflammatories (NSAID): Secondary | ICD-10-CM | POA: Insufficient documentation

## 2019-09-05 MED ORDER — NAPROXEN 250 MG PO TABS
500.0000 mg | ORAL_TABLET | Freq: Once | ORAL | Status: AC
  Administered 2019-09-05: 23:00:00 500 mg via ORAL
  Filled 2019-09-05: qty 2

## 2019-09-05 MED ORDER — METHOCARBAMOL 500 MG PO TABS: 500.0000 mg | ORAL_TABLET | Freq: Three times a day (TID) | ORAL | 0 refills | Status: DC | PRN

## 2019-09-05 MED ORDER — NAPROXEN 500 MG PO TABS: 500.0000 mg | ORAL_TABLET | Freq: Two times a day (BID) | ORAL | 0 refills | Status: DC

## 2019-09-05 MED ORDER — LIDOCAINE 5 % EX PTCH: 1.0000 | MEDICATED_PATCH | CUTANEOUS | 0 refills | Status: DC

## 2019-09-05 NOTE — Discharge Instructions (Addendum)
You were seen in the emergency department this evening for neck pain.  We suspect your symptoms are related to a muscle spasm/irritation, they also may be due to a rotator cuff tendinitis.  We are sending her with exercises to try.  We also send you home with the following medicines:  - Naproxen is a nonsteroidal anti-inflammatory medication that will help with pain and swelling. Be sure to take this medication as prescribed with food, 1 pill every 12 hours,  It should be taken with food, as it can cause stomach upset, and more seriously, stomach bleeding. Do not take other nonsteroidal anti-inflammatory medications with this such as Advil, Motrin, Aleve, Mobic, Goodie Powder, or Motrin.    - Robaxin is the muscle relaxer I have prescribed, this is meant to help with muscle tightness. Be aware that this medication may make you drowsy therefore the first time you take this it should be at a time you are in an environment where you can rest. Do not drive or operate heavy machinery when taking this medication. Do not drink alcohol or take other sedating medications with this medicine such as narcotics or benzodiazepines.   - lidoderm patch-this is a topical patch to place directly over your area of pain to help numb/to the muscle. You make take Tylenol per over the counter dosing with these medications.   We have prescribed you new medication(s) today. Discuss the medications prescribed today with your pharmacist as they can have adverse effects and interactions with your other medicines including over the counter and prescribed medications. Seek medical evaluation if you start to experience new or abnormal symptoms after taking one of these medicines, seek care immediately if you start to experience difficulty breathing, feeling of your throat closing, facial swelling, or rash as these could be indications of a more serious allergic reaction   Please try applying heat to the affected area.  Please  follow-up with your primary care provider and/or orthopedics within 1 week for reevaluation.  Return to the ER for new or worsening symptoms including but not limited to worsening pain, numbness, weakness, dropping things with your left hand, chest pain, trouble breathing, fever, or any other concerns.

## 2019-09-05 NOTE — ED Provider Notes (Signed)
[facility] EMERGENCY DEPARTMENT Provider Note   CSN: [id number] Arrival date & time: 09/05/19  2157     History Chief Complaint  Patient presents with  . Neck Pain    radiate down left arm    Michael Strong is a 39 y.o. male without significant past medical history who presents to the emergency department with complaints of left-sided neck pain that began at 1800 this evening.  Patient states the pain is located to the left side of his neck, radiates into the proximal upper extremity at times, states it is a tightness/sharp pain that is currently an 8 out of 10 in severity, worse with left upper extremity movement, no alleviating factors.  No intervention prior to arrival.  He states that he developed a gradual onset discomfort while he was at work doing heavy lifting.  No traumatic injury. Denies numbness, tingling, weakness, saddle anesthesia, incontinence to bowel/bladder, fever, chills, IV drug use, dysuria, or hx of cancer. Patient has not had prior neck/shoulder surgeries.  Denies chest pain or shortness of breath.   HPI     History reviewed. No pertinent past medical history.  Patient Active Problem List   Diagnosis Date Noted  . Fracture of navicular bone of foot, right, closed 04/30/2012    History reviewed. No pertinent surgical history.     History reviewed. No pertinent family history.  Social History   Tobacco Use  . Smoking status: Former Games developer  . Smokeless tobacco: Never Used  Substance Use Topics  . Alcohol use: No  . Drug use: No    Home Medications Prior to Admission medications   Medication Sig Start Date End Date Taking? Authorizing Provider  ibuprofen (ADVIL) 800 MG tablet Take 1 tablet (800 mg total) by mouth 3 (three) times daily. 07/25/19   Triplett, Tammy, PA-C  Lido-Capsaicin-Men-Methyl Sal 0.5-0.035-5-20 % PTCH Apply 1 patch topically daily as needed. 04/23/19   Mesner, Barbara Cower, MD  methocarbamol (ROBAXIN) 500 MG tablet Take 1 tablet (500  mg total) by mouth 3 (three) times daily. 07/25/19   Triplett, Tammy, PA-C  traMADol (ULTRAM) 50 MG tablet Take 1 tablet (50 mg total) by mouth every 6 (six) hours as needed. 07/25/19   Triplett, Babette Relic, PA-C    Allergies    Patient has no known allergies.  Review of Systems   Review of Systems  Constitutional: Negative for chills, fever and unexpected weight change.  Gastrointestinal: Negative for abdominal pain, nausea and vomiting.  Genitourinary: Negative for dysuria.  Musculoskeletal: Positive for arthralgias and neck pain.  Neurological: Negative for weakness and numbness.       Negative for saddle anesthesia or bowel/bladder incontinence.     Physical Exam Updated Vital Signs BP (!) 136/99   Pulse 75   Temp 98.2 F (36.8 C) (Oral)   Resp 16   Ht 5\' 11"  (1.803 m)   Wt 97.5 kg   SpO2 100%   BMI 29.99 kg/m   Physical Exam Vitals and nursing note reviewed.  Constitutional:      General: He is not in acute distress.    Appearance: Normal appearance. He is not ill-appearing or toxic-appearing.  HENT:     Head: Normocephalic and atraumatic.  Neck:     Comments: ROM intact. No midline tenderness. L paraspinal muscle tenderness specifically to trapezius area.  Cardiovascular:     Rate and Rhythm: Normal rate and regular rhythm.     Pulses:          Radial pulses  are 2+ on the right side and 2+ on the left side.  Pulmonary:     Effort: Pulmonary effort is normal. No respiratory distress.     Breath sounds: Normal breath sounds.  Musculoskeletal:     Cervical back: Neck supple.     Comments: Upper extremities: No obvious deformity, appreciable swelling, edema, erythema, ecchymosis, warmth, or open wounds. Patient has intact AROM throughout- does have discomfort with left upper extremity shoulder flexion and scaption.  Patient is tender over the left supraspinatus and subscapularis muscles.  No point/focal bony tenderness.  Patient does have some discomfort with left upper  extremity empty can test, good strength with this though.  Skin:    General: Skin is warm and dry.     Capillary Refill: Capillary refill takes less than 2 seconds.  Neurological:     Mental Status: He is alert.     Comments: Alert. Clear speech. Sensation grossly intact to bilateral upper extremities. 5/5 symmetric grip strength as well as strength with wrist flexion/extension, elbow flexion/extension, and shoulder flexion/extension.. Ambulatory.   Psychiatric:        Mood and Affect: Mood normal.        Behavior: Behavior normal.     ED Results / Procedures / Treatments   Labs (all labs ordered are listed, but only abnormal results are displayed) Labs Reviewed - No data to display  EKG None  Radiology No results found.  Procedures Procedures (including critical care time)  Medications Ordered in ED Medications - No data to display  ED Course  I have reviewed the triage vital signs and the nursing notes.  Pertinent labs & imaging results that were available during my care of the patient were reviewed by me and considered in my medical decision making (see chart for details).    MDM Rules/Calculators/A&P                      Patient presents to the emergency department with complaints of left-sided neck pain.  Patient nontoxic-appearing, resting comfortably, vitals WNL with the exception of elevated blood pressure, doubt HTN emergency.  Afebrile, no erythema, no warmth, no history of IVDU, no recent surgical interventions, do not suspect infectious process such as epidural abscess or septic joint.  There is no rash to indicate shingles.  No trauma, no midline spinal tenderness, no point/focal tenderness throughout the left upper extremity-do not suspect fracture, dislocation, or subluxation.  No focal neurologic deficits, does not seem consistent with cord compression.  Reproducible with left cervical paraspinal muscle palpation, the trapezius, the supraspinatus, and the  subscapularis-suspect muscular etiology, potentially rotator cuff tendinitis, will trial naproxen, Robaxin, and Lidoderm patches.  Discussed no driving or operating heavy machinery with Robaxin. I discussed results, treatment plan, need for follow-up, and return precautions with the patient. Provided opportunity for questions, patient confirmed understanding and is in agreement with plan.    Final Clinical Impression(s) / ED Diagnoses Final diagnoses:  Neck pain    Rx / DC Orders ED Discharge Orders         Ordered    naproxen (NAPROSYN) 500 MG tablet  2 times daily     09/05/19 2238    methocarbamol (ROBAXIN) 500 MG tablet  Every 8 hours PRN     09/05/19 2238    lidocaine (LIDODERM) 5 %  Every 24 hours     09/05/19 2238           Illa Enlow, Marion R, PA-C 09/05/19 2241  Maudie Flakes, MD 09/11/19 2055

## 2019-09-05 NOTE — ED Triage Notes (Signed)
Pt reports sudden onset of neck pain (left) with radiation down left arm, pt reports increased pain with flexion, extension, and turning head from side to side. No other symptoms reported.

## 2020-04-04 ENCOUNTER — Emergency Department (HOSPITAL_COMMUNITY): Payer: Managed Care, Other (non HMO)

## 2020-04-04 ENCOUNTER — Encounter (HOSPITAL_COMMUNITY): Payer: Self-pay | Admitting: Emergency Medicine

## 2020-04-04 ENCOUNTER — Emergency Department (HOSPITAL_COMMUNITY)
Admission: EM | Admit: 2020-04-04 | Discharge: 2020-04-04 | Disposition: A | Discharge status: Home / Self Care | Payer: Managed Care, Other (non HMO) | Attending: Emergency Medicine | Admitting: Emergency Medicine

## 2020-04-04 ENCOUNTER — Other Ambulatory Visit: Payer: Self-pay

## 2020-04-04 DIAGNOSIS — Z87891 Personal history of nicotine dependence: Secondary | ICD-10-CM | POA: Diagnosis not present

## 2020-04-04 DIAGNOSIS — M7918 Myalgia, other site: Secondary | ICD-10-CM | POA: Diagnosis not present

## 2020-04-04 DIAGNOSIS — M79642 Pain in left hand: Secondary | ICD-10-CM | POA: Insufficient documentation

## 2020-04-04 MED ORDER — IBUPROFEN 800 MG PO TABS: 800.0000 mg | ORAL_TABLET | Freq: Three times a day (TID) | ORAL | 0 refills | Status: DC
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
## 2020-04-04 NOTE — Discharge Instructions (Addendum)
Your x-rays are negative for any fractures or joint dislocation.  However there are a lot of connective tissue and cartilage structures and the hand and joints that can be injured and not show up on a plain x-ray.  Use the ibuprofen to help with pain relief.  Call Dr. Everardo Pacific for further evaluation and management of your injury.

## 2020-06-08 ENCOUNTER — Emergency Department (HOSPITAL_COMMUNITY)
Admission: EM | Admit: 2020-06-08 | Discharge: 2020-06-08 | Disposition: A | Discharge status: Home / Self Care | Payer: Managed Care, Other (non HMO) | Attending: Emergency Medicine | Admitting: Emergency Medicine

## 2020-06-08 ENCOUNTER — Other Ambulatory Visit: Payer: Self-pay

## 2020-06-08 ENCOUNTER — Encounter (HOSPITAL_COMMUNITY): Payer: Self-pay | Admitting: Emergency Medicine

## 2020-06-08 DIAGNOSIS — R300 Dysuria: Secondary | ICD-10-CM | POA: Insufficient documentation

## 2020-06-08 DIAGNOSIS — Z87891 Personal history of nicotine dependence: Secondary | ICD-10-CM | POA: Insufficient documentation

## 2020-06-08 LAB — URINALYSIS, ROUTINE W REFLEX MICROSCOPIC
Bilirubin Urine: NEGATIVE
Glucose, UA: NEGATIVE mg/dL
Ketones, ur: NEGATIVE mg/dL
Leukocytes,Ua: NEGATIVE
Nitrite: NEGATIVE
Protein, ur: NEGATIVE mg/dL
Specific Gravity, Urine: 1.028 (ref 1.005–1.030)
pH: 5 (ref 5.0–8.0)

## 2020-06-08 MED ORDER — CEFTRIAXONE SODIUM 500 MG IJ SOLR
500.0000 mg | Freq: Once | INTRAMUSCULAR | Status: AC
  Administered 2020-06-08: 500 mg via INTRAMUSCULAR
  Filled 2020-06-08: qty 500

## 2020-06-08 MED ORDER — DOXYCYCLINE HYCLATE 100 MG PO CAPS: 100.0000 mg | ORAL_CAPSULE | Freq: Two times a day (BID) | ORAL | 0 refills | Status: AC

## 2020-06-08 MED ORDER — STERILE WATER FOR INJECTION IJ SOLN
INTRAMUSCULAR | Status: AC
  Filled 2020-06-08: qty 10

## 2020-06-08 NOTE — ED Triage Notes (Signed)
Pt c/o dysuria for the last 3 days.

## 2020-06-08 NOTE — ED Provider Notes (Addendum)
[facility] EMERGENCY DEPARTMENT Provider Note   CSN: [id number] Arrival date & time: 06/08/20  2146     History Chief Complaint  Patient presents with   Dysuria    Michael Strong is a 39 y.o. male.  HPI   This patient is a 39 year old male with 3 or 4 days of dysuria, he states it burns when he pees, he has no drainage, no rash, no discharge, no fever, no testicular tenderness and no penile tenderness in fact he has no symptoms at all unless he is urinating.  He has no new sexual partners, he does report a history of gonorrhea 1 year ago.  No abdominal pain fevers chills nausea or vomiting.  No medications given prior to arrival.  History reviewed. No pertinent past medical history.  Patient Active Problem List   Diagnosis Date Noted   Fracture of navicular bone of foot, right, closed 04/30/2012    History reviewed. No pertinent surgical history.     History reviewed. No pertinent family history.  Social History   Tobacco Use   Smoking status: Former Smoker   Smokeless tobacco: Never Used  Building services engineer Use: Never used  Substance Use Topics   Alcohol use: No   Drug use: No    Home Medications Prior to Admission medications   Medication Sig Start Date End Date Taking? Authorizing Provider  doxycycline (VIBRAMYCIN) 100 MG capsule Take 1 capsule (100 mg total) by mouth 2 (two) times daily for 7 days. 06/08/20 06/15/20  Eber Hong, MD  ibuprofen (ADVIL) 800 MG tablet Take 1 tablet (800 mg total) by mouth 3 (three) times daily. 04/04/20   Burgess Amor, PA-C  lidocaine (LIDODERM) 5 % Place 1 patch onto the skin daily. Place patch directly over area of most significant pain. Remove & Discard patch within 12 hours. 09/05/19   Petrucelli, Samantha R, PA-C  methocarbamol (ROBAXIN) 500 MG tablet Take 1 tablet (500 mg total) by mouth every 8 (eight) hours as needed for muscle spasms. 09/05/19   Petrucelli, Samantha R, PA-C  naproxen (NAPROSYN) 500 MG tablet Take 1  tablet (500 mg total) by mouth 2 (two) times daily. 09/05/19   Petrucelli, Samantha R, PA-C  traMADol (ULTRAM) 50 MG tablet Take 1 tablet (50 mg total) by mouth every 6 (six) hours as needed. 07/25/19   Triplett, Babette Relic, PA-C    Allergies    Patient has no known allergies.  Review of Systems   Review of Systems  Constitutional: Negative for fever.  Gastrointestinal: Negative for abdominal pain, nausea and vomiting.  Genitourinary: Positive for dysuria. Negative for discharge, flank pain, penile pain, penile swelling, scrotal swelling and testicular pain.  Musculoskeletal: Negative for back pain.    Physical Exam Updated Vital Signs BP 135/81 (BP Location: Right Arm)    Pulse 89    Temp 99 F (37.2 C) (Oral)    Resp 17    Ht 1.778 m (5\' 10" )    Wt 97.5 kg    SpO2 100%    BMI 30.85 kg/m   Physical Exam Vitals and nursing note reviewed.  Constitutional:      Appearance: He is well-developed. He is not diaphoretic.  HENT:     Head: Normocephalic and atraumatic.  Eyes:     General:        Right eye: No discharge.        Left eye: No discharge.     Conjunctiva/sclera: Conjunctivae normal.  Pulmonary:  Effort: Pulmonary effort is normal. No respiratory distress.  Abdominal:     General: There is no distension.     Palpations: Abdomen is soft.  Skin:    General: Skin is warm and dry.     Findings: No erythema or rash.  Neurological:     Mental Status: He is alert.     Coordination: Coordination normal.     ED Results / Procedures / Treatments   Labs (all labs ordered are listed, but only abnormal results are displayed) Labs Reviewed  URINALYSIS, ROUTINE W REFLEX MICROSCOPIC - Abnormal; Notable for the following components:      Result Value   Hgb urine dipstick MODERATE (*)    Bacteria, UA RARE (*)    All other components within normal limits  URINE CULTURE    EKG None  Radiology No results found.  Procedures Procedures (including critical care  time)  Medications Ordered in ED Medications  cefTRIAXone (ROCEPHIN) injection 500 mg (has no administration in time range)    ED Course  I have reviewed the triage vital signs and the nursing notes.  Pertinent labs & imaging results that were available during my care of the patient were reviewed by me and considered in my medical decision making (see chart for details).  Clinical Course as of Jun 08 2340  Tue Jun 08, 2020  2335 UA with 11-20 WBC, rare bacteria - meds RRx   [BM]    Clinical Course User Index [BM] Eber Hong, MD   MDM Rules/Calculators/A&P                          The patient is in the hallway which precludes a good genitourinary exam, the patient endorses to me that he is a completely normal-appearing penis scrotum and testicles, he is circumcised and has no discharge or drainage.  We will check a urinalysis, if negative will treat for STDs, patient agreeable, vital signs normal  UA reviewed - 11-20 WBC, tx for STD, culture pending  Final Clinical Impression(s) / ED Diagnoses Final diagnoses:  Dysuria    Rx / DC Orders ED Discharge Orders         Ordered    doxycycline (VIBRAMYCIN) 100 MG capsule  2 times daily        06/08/20 2338           Eber Hong, MD 06/08/20 0488    Eber Hong, MD 06/08/20 [phone]

## 2020-06-08 NOTE — Discharge Instructions (Signed)
Your testing revealed some signs of infection, we have given you the shot and you will need to take the pills for the next 7 days. Seek medical exam for severe worsening symptoms.

## 2020-06-10 LAB — URINE CULTURE: Culture: NO GROWTH

## 2020-09-02 ENCOUNTER — Other Ambulatory Visit: Payer: Self-pay

## 2020-09-02 ENCOUNTER — Ambulatory Visit
Admission: EM | Admit: 2020-09-02 | Discharge: 2020-09-02 | Disposition: A | Discharge status: Home / Self Care | Payer: Managed Care, Other (non HMO) | Attending: Family Medicine | Admitting: Family Medicine

## 2020-09-02 DIAGNOSIS — B349 Viral infection, unspecified: Secondary | ICD-10-CM

## 2020-09-02 DIAGNOSIS — Z1152 Encounter for screening for COVID-19: Secondary | ICD-10-CM

## 2020-09-02 DIAGNOSIS — Z20822 Contact with and (suspected) exposure to covid-19: Secondary | ICD-10-CM

## 2020-09-02 DIAGNOSIS — R197 Diarrhea, unspecified: Secondary | ICD-10-CM

## 2020-09-02 DIAGNOSIS — R110 Nausea: Secondary | ICD-10-CM

## 2020-09-02 NOTE — ED Triage Notes (Signed)
Needs covid test

## 2020-09-04 LAB — NOVEL CORONAVIRUS, NAA: SARS-CoV-2, NAA: DETECTED — AB

## 2020-09-04 LAB — SARS-COV-2, NAA 2 DAY TAT

## 2020-09-05 NOTE — Discharge Instructions (Addendum)
May take imodium as needed for diarrhea  Your COVID and Flu tests are pending.  You should self quarantine until the test results are back.    Take Tylenol or ibuprofen as needed for fever or discomfort.  Rest and keep yourself hydrated.    Follow-up with your primary care provider if your symptoms are not improving.

## 2020-09-05 NOTE — ED Provider Notes (Signed)
[facility] CARE CENTER   [id number] 09/02/20 Arrival Time: 1844   CC: COVID symptoms  SUBJECTIVE: History from: patient.  Michael Strong is a 40 y.o. male who presents with covid exposure, diarrhea and nausea. Denies sick exposure to COVID, flu or strep. Denies recent travel. Has negative history of Covid. Has not completed Covid vaccines. Has not taken OTC medications for this. There are no aggravating or alleviating factors. Denies previous symptoms in the past. Denies fever, chills, fatigue, sinus pain, rhinorrhea, sore throat, SOB, wheezing, chest pain, nausea, changes in bladder habits.    ROS: As per HPI.  All other pertinent ROS negative.     No past medical history on file. No past surgical history on file. No Known Allergies No current facility-administered medications on file prior to encounter.   Current Outpatient Medications on File Prior to Encounter  Medication Sig Dispense Refill  . ibuprofen (ADVIL) 800 MG tablet Take 1 tablet (800 mg total) by mouth 3 (three) times daily. 21 tablet 0  . lidocaine (LIDODERM) 5 % Place 1 patch onto the skin daily. Place patch directly over area of most significant pain. Remove & Discard patch within 12 hours. 15 patch 0  . methocarbamol (ROBAXIN) 500 MG tablet Take 1 tablet (500 mg total) by mouth every 8 (eight) hours as needed for muscle spasms. 15 tablet 0  . naproxen (NAPROSYN) 500 MG tablet Take 1 tablet (500 mg total) by mouth 2 (two) times daily. 10 tablet 0  . traMADol (ULTRAM) 50 MG tablet Take 1 tablet (50 mg total) by mouth every 6 (six) hours as needed. 10 tablet 0   Social History   Socioeconomic History  . Marital status: Single    Spouse name: Not on file  . Number of children: Not on file  . Years of education: 23  . Highest education level: Not on file  Occupational History    Employer: [employer]  Tobacco Use  . Smoking status: Former Games developer  . Smokeless tobacco: Never Used  Vaping Use  . Vaping  Use: Never used  Substance and Sexual Activity  . Alcohol use: No  . Drug use: No  . Sexual activity: Not on file  Other Topics Concern  . Not on file  Social History Narrative  . Not on file   Social Determinants of Health   Financial Resource Strain: Not on file  Food Insecurity: Not on file  Transportation Needs: Not on file  Physical Activity: Not on file  Stress: Not on file  Social Connections: Not on file  Intimate Partner Violence: Not on file   No family history on file.  OBJECTIVE:  Vitals:   09/02/20 1945  BP: (!) 149/96  Pulse: 72  Resp: 16  Temp: 98.4 F (36.9 C)  TempSrc: Oral  SpO2: 96%     General appearance: alert; appears fatigued, but nontoxic; speaking in full sentences and tolerating own secretions HEENT: NCAT; Ears: EACs clear, TMs pearly gray; Eyes: PERRL.  EOM grossly intact. Sinuses: nontender; Nose: nares patent without rhinorrhea, Throat: oropharynx erythematous, cobblestoning present, tonsils non erythematous or enlarged, uvula midline  Neck: supple without LAD Lungs: unlabored respirations, symmetrical air entry; cough: absent; no respiratory distress; CTAB Heart: regular rate and rhythm.  Radial pulses 2+ symmetrical bilaterally Skin: warm and dry Psychological: alert and cooperative; normal mood and affect  LABS:  No results found for this or any previous visit (from the past 24 hour(s)).   ASSESSMENT & PLAN:  1. Viral  illness   2. Encounter for screening for COVID-19   3. Diarrhea, unspecified type   4. Nausea   5. Close exposure to COVID-19 virus    Continue supportive care at home COVID and flu testing ordered.  It will take between 1-2 days for test results.  Someone will contact you regarding abnormal results.   Work note provided Patient should remain in quarantine until they have received Covid results.  If negative you may resume normal activities (go back to work/school) while practicing hand hygiene, social distance,  and mask wearing.  If positive, patient should remain in quarantine for 10 days from symptom onset AND greater than 72 hours after symptoms resolution (absence of fever without the use of fever-reducing medication and improvement in respiratory symptoms), whichever is longer Get plenty of rest and push fluids Use OTC zyrtec for nasal congestion, runny nose, and/or sore throat Use OTC flonase for nasal congestion and runny nose Use medications daily for symptom relief Use OTC medications like ibuprofen or tylenol as needed fever or pain Call or go to the ED if you have any new or worsening symptoms such as fever, worsening cough, shortness of breath, chest tightness, chest pain, turning blue, changes in mental status.  Reviewed expectations re: course of current medical issues. Questions answered. Outlined signs and symptoms indicating need for more acute intervention. Patient verbalized understanding. After Visit Summary given.         Moshe Cipro, NP 09/05/20 1747

## 2020-10-26 ENCOUNTER — Other Ambulatory Visit: Payer: Self-pay

## 2020-10-26 ENCOUNTER — Encounter (HOSPITAL_COMMUNITY): Payer: Self-pay | Admitting: Emergency Medicine

## 2020-10-26 DIAGNOSIS — Z87891 Personal history of nicotine dependence: Secondary | ICD-10-CM | POA: Insufficient documentation

## 2020-10-26 DIAGNOSIS — R109 Unspecified abdominal pain: Secondary | ICD-10-CM | POA: Diagnosis not present

## 2020-10-26 DIAGNOSIS — M545 Low back pain, unspecified: Secondary | ICD-10-CM | POA: Diagnosis not present

## 2020-10-26 NOTE — ED Triage Notes (Signed)
Pt c/o left sided flank pain that started this AM.

## 2020-10-27 ENCOUNTER — Emergency Department (HOSPITAL_COMMUNITY)
Admission: EM | Admit: 2020-10-27 | Discharge: 2020-10-27 | Disposition: A | Discharge status: Home / Self Care | Payer: Managed Care, Other (non HMO) | Attending: Emergency Medicine | Admitting: Emergency Medicine

## 2020-10-27 DIAGNOSIS — M545 Low back pain, unspecified: Secondary | ICD-10-CM

## 2020-10-27 LAB — URINALYSIS, ROUTINE W REFLEX MICROSCOPIC
Bacteria, UA: NONE SEEN
Bilirubin Urine: NEGATIVE
Glucose, UA: NEGATIVE mg/dL
Ketones, ur: NEGATIVE mg/dL
Leukocytes,Ua: NEGATIVE
Nitrite: NEGATIVE
Protein, ur: NEGATIVE mg/dL
Specific Gravity, Urine: 1.02 (ref 1.005–1.030)
pH: 5 (ref 5.0–8.0)

## 2020-10-27 MED ORDER — KETOROLAC TROMETHAMINE 30 MG/ML IJ SOLN
30.0000 mg | Freq: Once | INTRAMUSCULAR | Status: AC
Start: 2020-10-27 — End: 2020-10-27
  Administered 2020-10-27: 30 mg via INTRAMUSCULAR
  Filled 2020-10-27: qty 1

## 2020-10-27 MED ORDER — IBUPROFEN 600 MG PO TABS: 600.0000 mg | ORAL_TABLET | Freq: Four times a day (QID) | ORAL | 0 refills | Status: DC | PRN

## 2020-10-27 MED ORDER — CYCLOBENZAPRINE HCL 5 MG PO TABS: 5.0000 mg | ORAL_TABLET | Freq: Two times a day (BID) | ORAL | 0 refills | Status: DC | PRN
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
## 2020-10-27 NOTE — Discharge Instructions (Addendum)
You were seen today for back pain. This is likely related to muscle strain. Continue ibuprofen at home. You may take Flexeril as needed. Do not drive while taking Flexeril.

## 2020-10-27 NOTE — ED Notes (Signed)
ED Provider at bedside. 

## 2021-08-16 ENCOUNTER — Emergency Department (HOSPITAL_COMMUNITY)
Admission: EM | Admit: 2021-08-16 | Discharge: 2021-08-16 | Disposition: A | Discharge status: Home / Self Care | Payer: Managed Care, Other (non HMO) | Attending: Emergency Medicine | Admitting: Emergency Medicine

## 2021-08-16 ENCOUNTER — Other Ambulatory Visit: Payer: Self-pay

## 2021-08-16 ENCOUNTER — Encounter (HOSPITAL_COMMUNITY): Payer: Self-pay

## 2021-08-16 DIAGNOSIS — R7309 Other abnormal glucose: Secondary | ICD-10-CM | POA: Insufficient documentation

## 2021-08-16 DIAGNOSIS — B350 Tinea barbae and tinea capitis: Secondary | ICD-10-CM

## 2021-08-16 DIAGNOSIS — L299 Pruritus, unspecified: Secondary | ICD-10-CM | POA: Diagnosis present

## 2021-08-16 DIAGNOSIS — Z87891 Personal history of nicotine dependence: Secondary | ICD-10-CM | POA: Insufficient documentation

## 2021-08-16 LAB — CBG MONITORING, ED: Glucose-Capillary: 102 mg/dL — ABNORMAL HIGH (ref 70–99)

## 2021-08-16 MED ORDER — FLUCONAZOLE 200 MG PO TABS: 400.0000 mg | ORAL_TABLET | Freq: Every day | ORAL | 0 refills | Status: AC

## 2021-08-16 NOTE — Discharge Instructions (Signed)
We suspect this is likely a fungal infection.  The antifungal medication you are given may need to be taken for several weeks so this prescription should be monitored by your primary doctor.  If this does not improve you should see a dermatologist for further evaluation. Return to the ED with new or worsening symptoms

## 2021-08-16 NOTE — ED Triage Notes (Signed)
Pt says for the last few weeks he has been getting knots on his scalp, and is now getting bald spots. He states some of them have busted and had drainage but do not go away.

## 2021-08-16 NOTE — ED Provider Notes (Signed)
[facility] EMERGENCY DEPARTMENT Provider Note   CSN: [id number] Arrival date & time: 08/16/21  0439     History Chief Complaint  Patient presents with   Cyst    Michael Strong is a 40 y.o. male.  Patient states several weeks of itchy lesions to his scalp coming and going in various spots.  He states with the lesions have resolved he now has bald spots.  He denies any pain but he does have itching.  Sometimes they have drainage that is clear.  No bleeding.  No fever.  States he wears hats a lot at work and sweats and is not sure if that is related.  No fevers.  No difficulty breathing or difficulty swallowing.  No vomiting.  No chest pain or shortness of breath. Denies any chronic medical conditions or medication use. No one else he lives with his similar lesions  The history is provided by the patient.      History reviewed. No pertinent past medical history.  Patient Active Problem List   Diagnosis Date Noted   Fracture of navicular bone of foot, right, closed 04/30/2012    No past surgical history on file.     No family history on file.  Social History   Tobacco Use   Smoking status: Former   Smokeless tobacco: Never  Building services engineer Use: Never used  Substance Use Topics   Alcohol use: No   Drug use: No    Home Medications Prior to Admission medications   Medication Sig Start Date End Date Taking? Authorizing Provider  cyclobenzaprine (FLEXERIL) 5 MG tablet Take 1 tablet (5 mg total) by mouth 2 (two) times daily as needed for muscle spasms. 10/27/20   Horton, Mayer Masker, MD  ibuprofen (ADVIL) 600 MG tablet Take 1 tablet (600 mg total) by mouth every 6 (six) hours as needed. 10/27/20   Horton, Mayer Masker, MD  ibuprofen (ADVIL) 800 MG tablet Take 1 tablet (800 mg total) by mouth 3 (three) times daily. 04/04/20   Burgess Amor, PA-C  lidocaine (LIDODERM) 5 % Place 1 patch onto the skin daily. Place patch directly over area of most significant pain. Remove &  Discard patch within 12 hours. 09/05/19   Petrucelli, Samantha R, PA-C  methocarbamol (ROBAXIN) 500 MG tablet Take 1 tablet (500 mg total) by mouth every 8 (eight) hours as needed for muscle spasms. 09/05/19   Petrucelli, Samantha R, PA-C  naproxen (NAPROSYN) 500 MG tablet Take 1 tablet (500 mg total) by mouth 2 (two) times daily. 09/05/19   Petrucelli, Samantha R, PA-C  traMADol (ULTRAM) 50 MG tablet Take 1 tablet (50 mg total) by mouth every 6 (six) hours as needed. 07/25/19   Triplett, Babette Relic, PA-C    Allergies    Patient has no known allergies.  Review of Systems   Review of Systems  Skin:  Positive for wound.   Physical Exam Updated Vital Signs BP 124/86 (BP Location: Right Arm)   Pulse 95   Temp 98.5 F (36.9 C)   Resp 19   Ht 5\' 10"  (1.778 m)   Wt 97.5 kg   SpO2 97%   BMI 30.85 kg/m   Physical Exam Vitals and nursing note reviewed.  Constitutional:      General: He is not in acute distress.    Appearance: He is well-developed.  HENT:     Head: Normocephalic.     Comments: Scalp has several areas of alopecia and areas of scaling.  No erythema or fluctuance.  No draining.  No bleeding. Small areas of skin lesions    Mouth/Throat:     Pharynx: No oropharyngeal exudate.  Eyes:     Conjunctiva/sclera: Conjunctivae normal.     Pupils: Pupils are equal, round, and reactive to light.  Neck:     Comments: No meningismus. Cardiovascular:     Rate and Rhythm: Normal rate and regular rhythm.     Heart sounds: Normal heart sounds. No murmur heard. Pulmonary:     Effort: Pulmonary effort is normal. No respiratory distress.     Breath sounds: Normal breath sounds.  Abdominal:     Palpations: Abdomen is soft.     Tenderness: There is no abdominal tenderness. There is no guarding or rebound.  Musculoskeletal:        General: No tenderness. Normal range of motion.     Cervical back: Normal range of motion and neck supple.  Skin:    General: Skin is warm.  Neurological:      Mental Status: He is alert and oriented to person, place, and time.     Cranial Nerves: No cranial nerve deficit.     Motor: No abnormal muscle tone.     Coordination: Coordination normal.     Comments:  5/5 strength throughout. CN 2-12 intact.Equal grip strength.   Psychiatric:        Behavior: Behavior normal.    ED Results / Procedures / Treatments   Labs (all labs ordered are listed, but only abnormal results are displayed) Labs Reviewed - No data to display  EKG None  Radiology No results found.  Procedures Procedures   Medications Ordered in ED Medications - No data to display  ED Course  I have reviewed the triage vital signs and the nursing notes.  Pertinent labs & imaging results that were available during my care of the patient were reviewed by me and considered in my medical decision making (see chart for details).    MDM Rules/Calculators/A&P                          Scalp lesions with alopecia. Suspicious for tinea capitis. No fever. Well appearing.  No drainable abscess at this time.   D/w patient empiric fluconazole but this will need to be monitored by his doctor and may require an extended course of several weeks.  Needs to see PCP or dermatologist to continue prescription if diagnosis confirmed.   Return precautions discussed.  Final Clinical Impression(s) / ED Diagnoses Final diagnoses:  Tinea capitis    Rx / DC Orders ED Discharge Orders     None        Slate Debroux, Annie Main, MD 08/16/21 [phone]

## 2021-08-18 ENCOUNTER — Ambulatory Visit: Payer: Managed Care, Other (non HMO) | Admitting: Nurse Practitioner

## 2021-09-26 ENCOUNTER — Ambulatory Visit (INDEPENDENT_AMBULATORY_CARE_PROVIDER_SITE_OTHER): Payer: Managed Care, Other (non HMO) | Admitting: Nurse Practitioner

## 2021-09-26 ENCOUNTER — Encounter: Payer: Self-pay | Admitting: Nurse Practitioner

## 2021-09-26 ENCOUNTER — Other Ambulatory Visit: Payer: Self-pay

## 2021-09-26 VITALS — BP 128/72 | HR 79 | Resp 16 | Ht 70.0 in | Wt 235.0 lb

## 2021-09-26 DIAGNOSIS — M7130 Other bursal cyst, unspecified site: Secondary | ICD-10-CM | POA: Diagnosis not present

## 2021-09-26 DIAGNOSIS — Z0001 Encounter for general adult medical examination with abnormal findings: Secondary | ICD-10-CM | POA: Insufficient documentation

## 2021-09-26 DIAGNOSIS — Z0000 Encounter for general adult medical examination without abnormal findings: Secondary | ICD-10-CM | POA: Diagnosis not present

## 2021-09-26 DIAGNOSIS — L659 Nonscarring hair loss, unspecified: Secondary | ICD-10-CM

## 2021-09-26 DIAGNOSIS — Z2821 Immunization not carried out because of patient refusal: Secondary | ICD-10-CM

## 2021-09-26 DIAGNOSIS — E669 Obesity, unspecified: Secondary | ICD-10-CM | POA: Diagnosis not present

## 2021-09-26 NOTE — Assessment & Plan Note (Signed)
Importance of healthy food choices with portion control discussed as well as eating regularly within 12  hour window.   The need to choose clean green food 50%-75% of time is discussed as well as make water the primary drink and set a goal for 64 ounces daily.  Patient reeducated about the importance of committment to minimum of 150 minutes of exercise per week.  Three meals at set times with snacks allowed between meals but they must be fruit or vegetable.   Aim to eat  over 12 hour period  for example 7 am to 7 pm. Stop after your last meal of the day.  Wt Readings from Last 3 Encounters:  09/26/21 235 lb 0.6 oz (106.6 kg)  08/16/21 215 lb (97.5 kg)  10/26/20 215 lb (97.5 kg)

## 2021-09-26 NOTE — Assessment & Plan Note (Signed)
Cyst on the lower part of the scalp close to the neck. Cyst was mobile , non tender. Will monitor.

## 2021-09-26 NOTE — Assessment & Plan Note (Signed)
Patient referred to dermatologist.  No sign of infection bald  spots.

## 2021-09-26 NOTE — Assessment & Plan Note (Signed)
Patient educated on the need for flu vaccine Tdap vaccine.  And COVID booster he verbalized understanding.  Patient refused flu and TDAP vaccines today.  Patient told to get the required vaccines at his pharmacy or get it from here once he makes up his mind to get the vaccines.

## 2021-09-26 NOTE — Patient Instructions (Signed)

## 2021-09-26 NOTE — Progress Notes (Signed)
New Patient Office Visit  Subjective:  Patient ID: Michael Strong, male    DOB: 10-Jun-1981  Age: 41 y.o. MRN: [id number]  CC:  Chief Complaint  Patient presents with   Establish Care   Hair/Scalp Problem    Pt has concerns about spots on his scalp    HPI Michael Strong presents to establish care. Pt cn not remember the name of his Previous PCP, he has been to his office pt is due for TDAP, COVID mad flu vaccinerefused all vaccines today. in years.  He was treated with antibiotics for tinea in his back in December 2022  Pt c/o bald spots on his head, since summer 2022. They itch, leak pus abd blood, spots started as one but  then started popping up everywhere.    No past medical history on file.  No past surgical history on file.  No family history on file.  Social History   Socioeconomic History   Marital status: Single    Spouse name: Not on file   Number of children: Not on file   Years of education: 12   Highest education level: Not on file  Occupational History    Employer: [employer]  Tobacco Use   Smoking status: Former   Smokeless tobacco: Never  Scientific laboratory technician Use: Never used  Substance and Sexual Activity   Alcohol use: No   Drug use: No   Sexual activity: Not on file  Other Topics Concern   Not on file  Social History Narrative   Not on file   Social Determinants of Health   Financial Resource Strain: Not on file  Food Insecurity: Not on file  Transportation Needs: Not on file  Physical Activity: Not on file  Stress: Not on file  Social Connections: Not on file  Intimate Partner Violence: Not on file    ROS Review of Systems  Constitutional: Negative.   HENT: Negative.    Eyes: Negative.   Respiratory: Negative.    Cardiovascular: Negative.   Gastrointestinal: Negative.   Endocrine: Negative.   Genitourinary: Negative.   Musculoskeletal: Negative.   Skin: Negative.        Has bald spot on the head   Allergic/Immunologic: Negative.   Neurological: Negative.   Hematological: Negative.   Psychiatric/Behavioral: Negative.     Objective:   Today's Vitals: There were no vitals taken for this visit.  Physical Exam Constitutional:      General: He is not in acute distress.    Appearance: He is obese. He is not ill-appearing, toxic-appearing or diaphoretic.  HENT:     Head:     Comments: Has a cyst on the back of the head.  Scalp with areas of alopecia, no sign of infection noted.     Right Ear: Tympanic membrane, ear canal and external ear normal. There is no impacted cerumen.     Left Ear: Tympanic membrane, ear canal and external ear normal. There is impacted cerumen.     Nose: No congestion or rhinorrhea.     Mouth/Throat:     Mouth: Mucous membranes are moist.     Pharynx: Oropharynx is clear. No oropharyngeal exudate or posterior oropharyngeal erythema.  Eyes:     General: No scleral icterus.       Right eye: No discharge.        Left eye: No discharge.     Pupils: Pupils are equal, round, and reactive to light.  Neck:  Vascular: No carotid bruit.  Cardiovascular:     Rate and Rhythm: Normal rate and regular rhythm.     Pulses: Normal pulses.     Heart sounds: Normal heart sounds. No murmur heard.   No friction rub. No gallop.  Pulmonary:     Effort: No respiratory distress.     Breath sounds: No stridor. No wheezing, rhonchi or rales.  Chest:     Chest wall: No tenderness.  Abdominal:     General: There is no distension.     Palpations: Abdomen is soft. There is no mass.     Tenderness: There is no abdominal tenderness. There is no right CVA tenderness, left CVA tenderness, guarding or rebound.     Hernia: No hernia is present.  Musculoskeletal:        General: No swelling, tenderness, deformity or signs of injury.     Cervical back: Neck supple. No rigidity or tenderness.     Right lower leg: No edema.     Left lower leg: No edema.  Lymphadenopathy:      Cervical: No cervical adenopathy.  Skin:    Coloration: Skin is not jaundiced or pale.     Findings: No bruising, erythema, lesion or rash.  Neurological:     Mental Status: He is alert and oriented to person, place, and time.     Cranial Nerves: No cranial nerve deficit.     Sensory: No sensory deficit.     Motor: No weakness.     Coordination: Coordination normal.     Gait: Gait normal.  Psychiatric:        Mood and Affect: Mood normal.        Behavior: Behavior normal.        Thought Content: Thought content normal.        Judgment: Judgment normal.    Assessment & Plan:   Problem List Items Addressed This Visit   None   Outpatient Encounter Medications as of 09/26/2021  Medication Sig   cyclobenzaprine (FLEXERIL) 5 MG tablet Take 1 tablet (5 mg total) by mouth 2 (two) times daily as needed for muscle spasms. (Patient not taking: Reported on 09/26/2021)   ibuprofen (ADVIL) 600 MG tablet Take 1 tablet (600 mg total) by mouth every 6 (six) hours as needed. (Patient not taking: Reported on 09/26/2021)   ibuprofen (ADVIL) 800 MG tablet Take 1 tablet (800 mg total) by mouth 3 (three) times daily. (Patient not taking: Reported on 09/26/2021)   lidocaine (LIDODERM) 5 % Place 1 patch onto the skin daily. Place patch directly over area of most significant pain. Remove & Discard patch within 12 hours. (Patient not taking: Reported on 09/26/2021)   methocarbamol (ROBAXIN) 500 MG tablet Take 1 tablet (500 mg total) by mouth every 8 (eight) hours as needed for muscle spasms. (Patient not taking: Reported on 09/26/2021)   naproxen (NAPROSYN) 500 MG tablet Take 1 tablet (500 mg total) by mouth 2 (two) times daily. (Patient not taking: Reported on 09/26/2021)   traMADol (ULTRAM) 50 MG tablet Take 1 tablet (50 mg total) by mouth every 6 (six) hours as needed. (Patient not taking: Reported on 09/26/2021)   No facility-administered encounter medications on file as of 09/26/2021.    Follow-up: No  follow-ups on file.   Renee Rival, FNP

## 2021-09-26 NOTE — Assessment & Plan Note (Signed)
Annual exam as documented.  Counseling done include healthy lifestyle involving committing to 150 minutes of exercise per week, heart healthy diet, and attaining healthy weight. The importance of adequate sleep also discussed.  Regular use of seat belt and home safety were also discussed . Changes in health habits are decided on by patient with goals and time frames set for achieving them. Immunization and cancer screening  needs are specifically addressed at this visit.   Physical

## 2021-09-27 ENCOUNTER — Other Ambulatory Visit: Payer: Self-pay | Admitting: Nurse Practitioner

## 2021-09-27 ENCOUNTER — Telehealth: Payer: Self-pay

## 2021-09-27 DIAGNOSIS — E559 Vitamin D deficiency, unspecified: Secondary | ICD-10-CM

## 2021-09-27 LAB — LIPID PANEL
Chol/HDL Ratio: 4.5 ratio (ref 0.0–5.0)
Cholesterol, Total: 176 mg/dL (ref 100–199)
HDL: 39 mg/dL — ABNORMAL LOW (ref >39)
LDL Chol Calc (NIH): 104 mg/dL — ABNORMAL HIGH (ref 0–99)
Triglycerides: 190 mg/dL — ABNORMAL HIGH (ref 0–149)
VLDL Cholesterol Cal: 33 mg/dL (ref 5–40)

## 2021-09-27 LAB — CMP14+EGFR
ALT: 66 IU/L — ABNORMAL HIGH (ref 0–44)
AST: 28 IU/L (ref 0–40)
Albumin/Globulin Ratio: 1.6 (ref 1.2–2.2)
Albumin: 4.6 g/dL (ref 4.0–5.0)
Alkaline Phosphatase: 95 IU/L (ref 44–121)
BUN/Creatinine Ratio: 7 — ABNORMAL LOW (ref 9–20)
BUN: 8 mg/dL (ref 6–24)
Bilirubin Total: 0.5 mg/dL (ref 0.0–1.2)
CO2: 25 mmol/L (ref 20–29)
Calcium: 9.5 mg/dL (ref 8.7–10.2)
Chloride: 100 mmol/L (ref 96–106)
Creatinine, Ser: 1.07 mg/dL (ref 0.76–1.27)
Globulin, Total: 2.9 g/dL (ref 1.5–4.5)
Glucose: 98 mg/dL (ref 70–99)
Potassium: 4.3 mmol/L (ref 3.5–5.2)
Sodium: 140 mmol/L (ref 134–144)
Total Protein: 7.5 g/dL (ref 6.0–8.5)
eGFR: 90 mL/min/{1.73_m2} (ref >59)

## 2021-09-27 LAB — CBC
Hematocrit: 43.8 % (ref 37.5–51.0)
Hemoglobin: 15.7 g/dL (ref 13.0–17.7)
MCH: 31.9 pg (ref 26.6–33.0)
MCHC: 35.8 g/dL — ABNORMAL HIGH (ref 31.5–35.7)
MCV: 89 fL (ref 79–97)
Platelets: 238 10*3/uL (ref 150–450)
RBC: 4.92 x10E6/uL (ref 4.14–5.80)
RDW: 13.9 % (ref 11.6–15.4)
WBC: 4.9 10*3/uL (ref 3.4–10.8)

## 2021-09-27 LAB — TSH: TSH: 1.44 u[IU]/mL (ref 0.450–4.500)

## 2021-09-27 LAB — VITAMIN D 25 HYDROXY (VIT D DEFICIENCY, FRACTURES): Vit D, 25-Hydroxy: 10.9 ng/mL — ABNORMAL LOW (ref 30.0–100.0)

## 2021-09-27 LAB — HEMOGLOBIN A1C
Est. average glucose Bld gHb Est-mCnc: 120 mg/dL
Hgb A1c MFr Bld: 5.8 % — ABNORMAL HIGH (ref 4.8–5.6)

## 2021-09-27 LAB — HIV ANTIBODY (ROUTINE TESTING W REFLEX): HIV Screen 4th Generation wRfx: NONREACTIVE

## 2021-09-27 LAB — HEPATITIS C ANTIBODY: Hep C Virus Ab: <0.1 s/co ratio (ref 0.0–0.9)
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
## 2021-09-27 MED ORDER — VITAMIN D3 25 MCG (1000 UT) PO CAPS
1000.0000 [IU] | ORAL_CAPSULE | Freq: Every day | ORAL | 3 refills | Status: DC
Start: 2021-11-29 — End: 2022-01-30

## 2021-09-27 MED ORDER — VITAMIN D (ERGOCALCIFEROL) 1.25 MG (50000 UNIT) PO CAPS: 50000.0000 [IU] | ORAL_CAPSULE | ORAL | 0 refills | Status: AC

## 2021-09-27 NOTE — Telephone Encounter (Signed)
Patient returning call lab results [phone]

## 2021-09-27 NOTE — Progress Notes (Signed)
Please review labs with patient.  His vitamin D level was 10.9.  Patient should start taking vitamin D 50,000 units once a week.  After completion of 50,000 units patient should start taking 1000 units of vitamin D every day.  Please schedule patient for an appointment to check vitamin D level in 4 months.   Patient is prediabetic he should avoid sugar soda cakes.  Patient cholesterol levels elevated, Eat a healthy diet, including lots of fruits and vegetables. Avoid foods with a lot of saturated and trans fats, such as red meat, butter, fried foods and cheese . Maintain a healthy weight.  Liver function is elevated, avoid tylenol and alcohol.  Other labs are stable. Thanks

## 2022-01-17 ENCOUNTER — Encounter (HOSPITAL_COMMUNITY): Payer: Self-pay | Admitting: Emergency Medicine

## 2022-01-17 ENCOUNTER — Other Ambulatory Visit: Payer: Self-pay

## 2022-01-17 ENCOUNTER — Emergency Department (HOSPITAL_COMMUNITY)
Admission: EM | Admit: 2022-01-17 | Discharge: 2022-01-17 | Disposition: A | Discharge status: Home / Self Care | Payer: Managed Care, Other (non HMO) | Attending: Emergency Medicine | Admitting: Emergency Medicine

## 2022-01-17 DIAGNOSIS — L0291 Cutaneous abscess, unspecified: Secondary | ICD-10-CM

## 2022-01-17 DIAGNOSIS — L02811 Cutaneous abscess of head [any part, except face]: Secondary | ICD-10-CM | POA: Diagnosis not present

## 2022-01-17 MED ORDER — LIDOCAINE-EPINEPHRINE (PF) 2 %-1:200000 IJ SOLN
10.0000 mL | Freq: Once | INTRAMUSCULAR | Status: AC
  Administered 2022-01-17: 10 mL
  Filled 2022-01-17: qty 20

## 2022-01-17 NOTE — ED Provider Triage Note (Signed)
Emergency Medicine Provider Triage Evaluation Note ? ?Marguerite Olea , a 41 y.o. male  was evaluated in triage.  Pt complains of "knot" to back of head that he noticed yesterday.  Drained some white material yesterday per patient.  Denies fever.  No hx of same. ? ?Review of Systems  ?Positive: "Knot" on head ?Negative: fever ? ?Physical Exam  ?BP (!) 155/102   Pulse 80   Temp 98.5 ?F (36.9 ?C) (Oral)   Resp 15   SpO2 100%  ?Gen:   Awake, no distress   ?Resp:  Normal effort  ?MSK:   Moves extremities without difficulty  ?Other:  Appears to have sebaceous cyst to occipital scalp, locally tender, no active drainage ? ?Medical Decision Making  ?Medically screening exam initiated at 6:24 AM.  Appropriate orders placed.  JOESIAH LONON was informed that the remainder of the evaluation will be completed by another provider, this initial triage assessment does not replace that evaluation, and the importance of remaining in the ED until their evaluation is complete. ? ?Appears to have sebaceous cyst of the scalp.  Likely benefit from I&D. ?  ?Garlon Hatchet, PA-C ?01/17/22 0177 ? ?

## 2022-01-17 NOTE — ED Triage Notes (Signed)
Pt reported to ED with c/o "knot" on posterior head since yesterday. Pt states that it has has some white drainage. Denies any other symptoms at this time. ?

## 2022-01-17 NOTE — ED Notes (Signed)
This RN noted that ESI 3 was clicked incidentally versus ESI 4. Pt very stable and does not appear to require multiple resources. ?

## 2022-01-17 NOTE — Discharge Instructions (Signed)
Warm compresses or warm shower letting the water run over the area is helpful.  Avoid the solution in the area where the drainage took place.  Continue your doxycycline.  Tylenol and ibuprofen as needed for pain.  Continue to keep an eye on your blood pressure.  If it remains elevated your regular doctor should be able to manage this. ?

## 2022-01-17 NOTE — ED Provider Notes (Signed)
?[facility] EMERGENCY DEPARTMENT ?Provider Note ? ? ?CSN: [id number] ?Arrival date & time: 01/17/22  0536 ? ?  ? ?History ? ?Chief Complaint  ?Patient presents with  ? Abscess  ? ? ?Michael Strong is a 41 y.o. male. ? ?Patient is a 41 year old male presenting today with worsening swelling, pain and drainage from an area on the back of his scalp.  His wife reports that yesterday it did drain some clear fluid and blood and its not quite as swollen as it was yesterday but still very tender.  Patient reports he has had ongoing issues with this for some time now.  He did see a dermatologist but the time he saw them he was just having areas of hair loss that they thought might have been alopecia.  He has had these areas injected before with steroids but denies ever having them I&D.  He has a cyst at the bottom of his neck but reports that is unchanged and feels no different than what it normally does.  He has not had a recent haircut and continues to take doxycycline twice daily and a solution that he is supposed to put on his scalp.  He denies any fever or systemic symptoms. ? ?The history is provided by the patient and the spouse.  ?Abscess ? ?  ? ?Home Medications ?Prior to Admission medications   ?Medication Sig Start Date End Date Taking? Authorizing Provider  ?Cholecalciferol (VITAMIN D3) 25 MCG (1000 UT) CAPS Take 1 capsule (1,000 Units total) by mouth daily. 11/29/21   Donell Beers, FNP  ?   ? ?Allergies    ?Patient has no known allergies.   ? ?Review of Systems   ?Review of Systems ? ?Physical Exam ?Updated Vital Signs ?BP (!) 171/111   Pulse 88   Temp 98.5 ?F (36.9 ?C) (Oral)   Resp 18   SpO2 96%  ?Physical Exam ?Vitals and nursing note reviewed.  ?Constitutional:   ?   Appearance: Normal appearance. He is normal weight.  ?HENT:  ?   Head: Normocephalic.  ? ?Neck:  ?   Comments: No palpable lymph nodes present in the posterior cervical or anterior cervical chains ?Cardiovascular:  ?    Rate and Rhythm: Normal rate.  ?Pulmonary:  ?   Effort: Pulmonary effort is normal.  ?Skin: ?   General: Skin is warm.  ?Neurological:  ?   Mental Status: He is alert.  ?Psychiatric:     ?   Mood and Affect: Mood normal.  ? ? ?ED Results / Procedures / Treatments   ?Labs ?(all labs ordered are listed, but only abnormal results are displayed) ?Labs Reviewed - No data to display ? ?EKG ?None ? ?Radiology ?No results found. ? ?Procedures ?Procedures  ? ? ?INCISION AND DRAINAGE ?Performed by: Shad Ledvina ?Consent: Verbal consent obtained. ?Risks and benefits: risks, benefits and alternatives were discussed ?Type: abscess ? ?Body area: scalp ? ?Anesthesia: local infiltration ? ?Incision was made with a scalpel. ? ?Local anesthetic: lidocaine 2% with epinephrine ? ?Anesthetic total: 3 ml ? ?Complexity: simple ? ? ?Drainage: purulent ? ?Drainage amount: 37mL ? ?Packing material: none ?Patient tolerance: Patient tolerated the procedure well with no immediate complications. ? ? ? ?Medications Ordered in ED ?Medications  ?lidocaine-EPINEPHrine (XYLOCAINE W/EPI) 2 %-1:200000 (PF) injection 10 mL (has no administration in time range)  ? ? ?ED Course/ Medical Decision Making/ A&P ?  ?                        ?  Medical Decision Making ?Risk ?Prescription drug management. ? ? ?Patient with what appears to be an abscess in his scalp.  Low suspicion for tinea capitis at this time.  This has been an ongoing issue with the patient and unable to see dermatology notes and unclear of the specific diagnosis but he does take doxycycline twice daily and uses a topical solution.  This is the first time this has happened in quite some time.  He reports in the past he has had to have them injected with steroids.  Today and is inflamed, erythematous and bedside ultrasound shows a fluid collection.  I&D as above.  Encourage patient to continue doxycycline, he has an appointment with his dermatologist in 1 week. ?Patient is noted to be  hypertensive here today however when speaking with him at his recent doctors visits he has never been told he has high blood pressure.  Most likely related to pain and situation.  Did recommend that they keep an eye on it and if it remains elevated he should follow-up with PCP. ? ? ? ? ? ? ? ?Final Clinical Impression(s) / ED Diagnoses ?Final diagnoses:  ?Abscess  ? ? ?Rx / DC Orders ?ED Discharge Orders   ? ? None  ? ?  ? ? ?  ?Gwyneth Sprout, MD ?01/17/22 [phone] ? ?

## 2022-01-17 NOTE — ED Notes (Signed)
Patient verbalizes understanding of discharge instructions. Opportunity for questioning and answers were provided. Pt discharged from ED. 

## 2022-01-26 ENCOUNTER — Ambulatory Visit: Payer: Managed Care, Other (non HMO) | Admitting: Nurse Practitioner

## 2022-01-27 ENCOUNTER — Encounter: Payer: Self-pay | Admitting: Nurse Practitioner

## 2022-01-27 ENCOUNTER — Ambulatory Visit (INDEPENDENT_AMBULATORY_CARE_PROVIDER_SITE_OTHER): Payer: Managed Care, Other (non HMO) | Admitting: Nurse Practitioner

## 2022-01-27 VITALS — BP 140/96 | HR 65 | Ht 70.0 in | Wt 230.0 lb

## 2022-01-27 DIAGNOSIS — E669 Obesity, unspecified: Secondary | ICD-10-CM

## 2022-01-27 DIAGNOSIS — Z23 Encounter for immunization: Secondary | ICD-10-CM

## 2022-01-27 DIAGNOSIS — I10 Essential (primary) hypertension: Secondary | ICD-10-CM | POA: Diagnosis not present

## 2022-01-27 DIAGNOSIS — E782 Mixed hyperlipidemia: Secondary | ICD-10-CM

## 2022-01-27 DIAGNOSIS — E559 Vitamin D deficiency, unspecified: Secondary | ICD-10-CM

## 2022-01-27 MED ORDER — AMLODIPINE BESYLATE 2.5 MG PO TABS: 2.5000 mg | ORAL_TABLET | Freq: Every day | ORAL | 0 refills | Status: DC

## 2022-01-27 MED ORDER — UNABLE TO FIND: 0 refills | Status: AC

## 2022-01-27 NOTE — Assessment & Plan Note (Signed)
Currently on vitamin D 1000 units daily Has completed  vitamin D 50,000 units dose Check vitamin D levels today

## 2022-01-27 NOTE — Assessment & Plan Note (Signed)
Lab Results  Component Value Date   CHOL 176 09/26/2021   HDL 39 (L) 09/26/2021   LDLCALC 104 (H) 09/26/2021   TRIG 190 (H) 09/26/2021   CHOLHDL 4.5 09/26/2021  Triglycerides was elevated at last visit Check levels today Avoid fatty fried foods simple carbohydrates

## 2022-01-27 NOTE — Patient Instructions (Addendum)
  Please start taking amlodipine 2.5mg  daily , monitor your blood  pressure at home , keep a log and bring to your next follow up appointment in 4 weeks .    Please order a blood pressure machine today    It is important that you exercise regularly at least 30 minutes 5 times a week.  Think about what you will eat, plan ahead. Choose " clean, green, fresh or frozen" over canned, processed or packaged foods which are more sugary, salty and fatty. 70 to 75% of food eaten should be vegetables and fruit. Three meals at set times with snacks allowed between meals, but they must be fruit or vegetables. Aim to eat over a 12 hour period , example 7 am to 7 pm, and STOP after  your last meal of the day. Drink water,generally about 64 ounces per day, no other drink is as healthy. Fruit juice is best enjoyed in a healthy way, by EATING the fruit.  Thanks for choosing [facility], we consider it a privelige to serve you.

## 2022-01-27 NOTE — Progress Notes (Signed)
   Michael Strong     MRN: [id number]      DOB: 01/25/1981   HPI Michael Strong with past medical history of obesity, vitamin D deficiency, hyperlipidemia, elevated BP  is here for follow up for elevated blood pressure, vitamin D deficiency, hyperlipidemia.   Patient was recently seen in the ED for abscess of his scalp, patient states that the abscess now resolved.   Hypertension.  Currently not on medication,  denies chest pain, edema, palpitation, syncope.  Need for blood pressure medication discussed with patient.  Patient agreeable to starting amlodipine today.       ROS Denies recent fever or chills. Denies sinus pressure, nasal congestion, ear pain or sore throat. Denies chest congestion, productive cough or wheezing. Denies chest pains, palpitations and leg swelling Denies abdominal pain, nausea, vomiting,diarrhea or constipation.   Denies dysuria, frequency, hesitancy or incontinence. Denies joint pain, swelling and limitation in mobility. Denies headaches, seizures, numbness, or tingling. Denies depression, anxiety or insomnia.    PE  BP (!) 140/96   Pulse 65   Ht 5\' 10"  (1.778 m)   Wt 230 lb (104.3 kg)   SpO2 96%   BMI 33.00 kg/m   Patient alert and oriented and in no cardiopulmonary distress.  HEENT: No facial asymmetry, EOMI,     Neck supple .  Chest: Clear to auscultation bilaterally.  CVS: S1, S2 no murmurs, no S3.Regular rate.  ABD: Soft non tender.   Ext: No edema  MS: Adequate ROM spine, shoulders, hips and knees.  Skin: Intact, no ulcerations or rash noted.  Psych: Good eye contact, normal affect. Memory intact not anxious or depressed appearing.d.   Assessment & Plan  Obesity (BMI 30.0-34.9) Wt Readings from Last 3 Encounters:  01/27/22 230 lb (104.3 kg)  09/26/21 235 lb 0.6 oz (106.6 kg)  08/16/21 215 lb (97.5 kg)  Patient has lost 5 pounds since last visit states that he has been walking a lot at work Need to increase intake of  whole food consisting daily vegetables and protein less carbohydrate drinking at least 64 ounces of water daily engaging in regular moderate exercise with at least 150 minutes discussed with patient.  Benefits of maintaining healthy weight discussed with patient  Vitamin D deficiency Currently on vitamin D 1000 units daily Has completed  vitamin D 50,000 units dose Check vitamin D levels today  Hypertension BP Readings from Last 3 Encounters:  01/27/22 (!) 140/96  01/17/22 (!) 166/108  09/26/21 128/72  Currently not on medication Start amlodipine 2.5 mg daily, monitor blood pressure at home keep a log and return in 4 weeks for follow-up DASH diet advised engage in regular moderate exercises at least 150 minutes weekly.  Mixed hyperlipidemia Lab Results  Component Value Date   CHOL 176 09/26/2021   HDL 39 (L) 09/26/2021   LDLCALC 104 (H) 09/26/2021   TRIG 190 (H) 09/26/2021   CHOLHDL 4.5 09/26/2021  Triglycerides was elevated at last visit Check levels today Avoid fatty fried foods simple carbohydrates   Need for Tdap vaccination Patient educated on CDC recommendation for the vaccine. Verbal consent was obtained from the patient, vaccine administered by nurse, no sign of adverse reactions noted at this time. Patient education on arm soreness and use of tylenol or ibuprofen was discussed. Patient educated on the signs and symptoms of adverse effect and advise to contact the office if they occur.t.

## 2022-01-27 NOTE — Assessment & Plan Note (Signed)
BP Readings from Last 3 Encounters:  01/27/22 (!) 140/96  01/17/22 (!) 166/108  09/26/21 128/72  Currently not on medication Start amlodipine 2.5 mg daily, monitor blood pressure at home keep a log and return in 4 weeks for follow-up DASH diet advised engage in regular moderate exercises at least 150 minutes weekly.

## 2022-01-27 NOTE — Assessment & Plan Note (Signed)
Patient educated on CDC recommendation for the vaccine. Verbal consent was obtained from the patient, vaccine administered by nurse, no sign of adverse reactions noted at this time. Patient education on arm soreness and use of tylenol or ibuprofen was discussed. Patient educated on the signs and symptoms of adverse effect and advise to contact the office if they occur.t.

## 2022-01-27 NOTE — Assessment & Plan Note (Addendum)
Wt Readings from Last 3 Encounters:  01/27/22 230 lb (104.3 kg)  09/26/21 235 lb 0.6 oz (106.6 kg)  08/16/21 215 lb (97.5 kg)  Patient has lost 5 pounds since last visit states that he has been walking a lot at work Need to increase intake of whole food consisting daily vegetables and protein less carbohydrate drinking at least 64 ounces of water daily engaging in regular moderate exercise with at least 150 minutes discussed with patient.  Benefits of maintaining healthy weight discussed with patient

## 2022-01-28 LAB — LIPID PANEL
Chol/HDL Ratio: 4.3 ratio (ref 0.0–5.0)
Cholesterol, Total: 150 mg/dL (ref 100–199)
HDL: 35 mg/dL — ABNORMAL LOW (ref >39)
LDL Chol Calc (NIH): 80 mg/dL (ref 0–99)
Triglycerides: 206 mg/dL — ABNORMAL HIGH (ref 0–149)
VLDL Cholesterol Cal: 35 mg/dL (ref 5–40)

## 2022-01-28 LAB — VITAMIN D 25 HYDROXY (VIT D DEFICIENCY, FRACTURES): Vit D, 25-Hydroxy: 21.7 ng/mL — ABNORMAL LOW (ref 30.0–100.0)

## 2022-01-30 ENCOUNTER — Other Ambulatory Visit: Payer: Self-pay | Admitting: Nurse Practitioner

## 2022-01-30 DIAGNOSIS — E781 Pure hyperglyceridemia: Secondary | ICD-10-CM

## 2022-01-30 MED ORDER — VITAMIN D3 25 MCG (1000 UT) PO CAPS: ORAL_CAPSULE | ORAL | 3 refills | Status: AC

## 2022-01-30 MED ORDER — FISH OIL 1000 MG PO CAPS: 1000.0000 mg | ORAL_CAPSULE | Freq: Two times a day (BID) | ORAL | 1 refills | Status: DC

## 2022-01-30 NOTE — Progress Notes (Signed)
Vitamin D level is low but much improved.pt should take vitamin D 2000 Units daily.  Triglycerides is higher than before, pt should start taking over the counter fish oil supplements, avoid simple carbohydrates, alcohol , fatty fried foods.

## 2022-01-30 NOTE — Progress Notes (Signed)
Pt should  take fish oil 1000mg  BID.

## 2022-02-02 ENCOUNTER — Encounter: Payer: Self-pay | Admitting: Nurse Practitioner

## 2022-02-24 ENCOUNTER — Ambulatory Visit (INDEPENDENT_AMBULATORY_CARE_PROVIDER_SITE_OTHER): Payer: Managed Care, Other (non HMO) | Admitting: Nurse Practitioner

## 2022-02-24 ENCOUNTER — Encounter: Payer: Self-pay | Admitting: Nurse Practitioner

## 2022-02-24 VITALS — BP 136/82 | HR 75 | Ht 70.0 in | Wt 226.0 lb

## 2022-02-24 DIAGNOSIS — M25562 Pain in left knee: Secondary | ICD-10-CM

## 2022-02-24 DIAGNOSIS — G8929 Other chronic pain: Secondary | ICD-10-CM | POA: Diagnosis not present

## 2022-02-24 DIAGNOSIS — I10 Essential (primary) hypertension: Secondary | ICD-10-CM | POA: Diagnosis not present

## 2022-02-24 MED ORDER — AMLODIPINE BESYLATE 2.5 MG PO TABS: 2.5000 mg | ORAL_TABLET | Freq: Every day | ORAL | 1 refills | Status: DC

## 2022-02-24 MED ORDER — IBUPROFEN 600 MG PO TABS: 600.0000 mg | ORAL_TABLET | Freq: Two times a day (BID) | ORAL | 0 refills | Status: DC

## 2022-02-24 NOTE — Progress Notes (Signed)
   Michael Strong     MRN: [id number]      DOB: 02-Apr-1981   HPI Mr. Slomka with past medical history of hypertension, obesity, vitamin D deficiency is here for follow up for hypertension  Hypertension.  Currently taking amlodipine 2.5 mg daily, medication was started 4 weeks ago, reports that his systolic blood pressure has been less than 140s at home denies chest pain, edema, dizziness.   Chronic left knee pain .patient complaining of acute on chronic  left knee pain, states that his knee sometimes feels stiff, feels like popping, states that he does a lot of walking and climbing at work, currently has aching pain, 7/10 today, patient denies numbness, tingling he has been taking Tylenol as needed.        ROS Denies recent fever or chills. Denies sinus pressure, nasal congestion, ear pain or sore throat. Denies chest congestion, productive cough or wheezing. Denies chest pains, palpitations and leg swelling Denies abdominal pain, nausea, vomiting,diarrhea or constipation.   Denies dysuria, frequency, hesitancy or incontinence. Denies headaches, seizures, numbness, or tingling. Denies depression, anxiety or insomnia.   PE  BP 136/82 (BP Location: Right Arm, Cuff Size: Large)   Pulse 75   Ht 5\' 10"  (1.778 m)   Wt 226 lb (102.5 kg)   SpO2 96%   BMI 32.43 kg/m   Patient alert and oriented and in no cardiopulmonary distress.  Chest: Clear to auscultation bilaterally.  CVS: S1, S2 no murmurs, no S3.Regular rate.  ABD: Soft non tender.   Ext: No edema  MS: Adequate ROM spine, shoulders, hips .  Tenderness on range of motion of left knee, no swelling or redness noted left knee and leg warm to touch has palpable pedal pulse   Psych: Good eye contact, normal affect. Memory intact not anxious or depressed appearing.  CNS: CN 2-12 intact, power,  normal throughout.no focal deficits noted.   Assessment & Plan Hypertension BP Readings from Last 3 Encounters:   02/24/22 136/82  01/27/22 (!) 140/96  01/17/22 (!) 166/108  Currently on amlodipine 2.5 mg daily Continue current medication DASH diet advised engage in regular moderate exercises at least 150 minutes weekly Follow-up in 6 months  Chronic pain of left knee Most likely due to arthritis Take ibuprofen 600 mg twice daily as needed Alternate with OTC Tylenol

## 2022-03-07 ENCOUNTER — Emergency Department (HOSPITAL_COMMUNITY): Payer: Managed Care, Other (non HMO)

## 2022-03-07 ENCOUNTER — Encounter (HOSPITAL_COMMUNITY): Payer: Self-pay | Admitting: Emergency Medicine

## 2022-03-07 ENCOUNTER — Emergency Department (HOSPITAL_COMMUNITY)
Admission: EM | Admit: 2022-03-07 | Discharge: 2022-03-07 | Disposition: A | Discharge status: Home / Self Care | Payer: Managed Care, Other (non HMO) | Attending: Emergency Medicine | Admitting: Emergency Medicine

## 2022-03-07 ENCOUNTER — Other Ambulatory Visit: Payer: Self-pay

## 2022-03-07 DIAGNOSIS — Z79899 Other long term (current) drug therapy: Secondary | ICD-10-CM | POA: Diagnosis not present

## 2022-03-07 DIAGNOSIS — I10 Essential (primary) hypertension: Secondary | ICD-10-CM | POA: Insufficient documentation

## 2022-03-07 DIAGNOSIS — M25562 Pain in left knee: Secondary | ICD-10-CM | POA: Insufficient documentation

## 2022-03-07 DIAGNOSIS — M25462 Effusion, left knee: Secondary | ICD-10-CM | POA: Diagnosis not present

## 2022-03-07 HISTORY — DX: Essential (primary) hypertension: I10

## 2022-03-07 MED ORDER — PREDNISONE 20 MG PO TABS
40.0000 mg | ORAL_TABLET | Freq: Once | ORAL | Status: AC
  Administered 2022-03-07: 40 mg via ORAL
  Filled 2022-03-07: qty 2

## 2022-03-07 MED ORDER — PREDNISONE 10 MG PO TABS: 20.0000 mg | ORAL_TABLET | Freq: Two times a day (BID) | ORAL | 0 refills | Status: DC

## 2022-03-07 NOTE — Discharge Instructions (Signed)
Begin taking prednisone as prescribed.  Take ibuprofen 600 mg every 6 hours as needed for pain.  Follow-up with primary doctor if symptoms are not improving in the next week.

## 2022-03-07 NOTE — ED Triage Notes (Signed)
Pt c/o left knee pain for a couple of weeks. No injury noted.

## 2022-03-07 NOTE — ED Notes (Signed)
Pt ambulatory to tx room from lobby

## 2022-03-07 NOTE — ED Provider Notes (Signed)
[facility] EMERGENCY DEPARTMENT Provider Note   CSN: [id number] Arrival date & time: 03/07/22  0010     History  Chief Complaint  Patient presents with   Knee Pain    Michael Strong is a 41 y.o. male.  Patient is a 41 year old male with history of hypertension and hyperlipidemia.  Patient presenting with complaints of left knee pain.  This started several days ago in the absence of any specific injury or trauma.  He does report working driving a Nurse, mental health labor at work.  Pain is worse with bearing weight and ambulation.  It is alleviated with rest.  The history is provided by the patient.       Home Medications Prior to Admission medications   Medication Sig Start Date End Date Taking? Authorizing Provider  amLODipine (NORVASC) 2.5 MG tablet Take 1 tablet (2.5 mg total) by mouth daily. 02/24/22   Donell Beers, FNP  Cholecalciferol (VITAMIN D3) 25 MCG (1000 UT) CAPS Take 2000 units daily 01/30/22   Paseda, Baird Kay, FNP  doxycycline (VIBRA-TABS) 100 MG tablet Take 100 mg by mouth 2 (two) times daily as needed. 10/30/21   [provider]  ibuprofen (ADVIL) 600 MG tablet Take 1 tablet (600 mg total) by mouth 2 (two) times daily. 02/24/22   Paseda, Baird Kay, FNP  Omega-3 Fatty Acids (FISH OIL) 1000 MG CAPS Take 1 capsule (1,000 mg total) by mouth 2 (two) times daily. Patient not taking: Reported on 02/24/2022 01/30/22   Donell Beers, FNP  UNABLE TO FIND Blood pressure cuff DX: I10 01/27/22   Donell Beers, FNP      Allergies    Patient has no known allergies.    Review of Systems   Review of Systems  All other systems reviewed and are negative.   Physical Exam Updated Vital Signs BP (!) 141/99   Pulse 70   Temp 98 F (36.7 C)   Resp 18   Ht 5\' 10"  (1.778 m)   Wt 102.5 kg   SpO2 100%   BMI 32.43 kg/m  Physical Exam Vitals and nursing note reviewed.  Constitutional:      General: He is not in acute distress.     Appearance: Normal appearance. He is not ill-appearing.  HENT:     Head: Normocephalic and atraumatic.  Pulmonary:     Effort: Pulmonary effort is normal.  Musculoskeletal:     Comments: The left knee does have perhaps a small effusion present.  He has good range of motion with no crepitus.  The knee is stable anteriorly and posteriorly.  There is no varus or valgus laxity.  Skin:    General: Skin is warm and dry.  Neurological:     Mental Status: He is alert.     ED Results / Procedures / Treatments   Labs (all labs ordered are listed, but only abnormal results are displayed) Labs Reviewed - No data to display  EKG None  Radiology DG Knee Complete 4 Views Left  Result Date: 03/07/2022 CLINICAL DATA:  Left knee pain for 2 weeks.  No known injury. EXAM: LEFT KNEE - COMPLETE 4+ VIEW COMPARISON:  Radiograph 09/09/2017 FINDINGS: No evidence of fracture or dislocation. Normal joint spaces. Normal alignment. No evidence of arthropathy or other focal bone abnormality. There may be a small knee joint effusion. Soft tissues are unremarkable. IMPRESSION: Possible small knee joint effusion. No osseous abnormality. Electronically Signed   By: 11/07/2017.D.  On: 03/07/2022 01:35    Procedures Procedures    Medications Ordered in ED Medications - No data to display  ED Course/ Medical Decision Making/ A&P  Patient presenting with left knee pain.  He does have perhaps a small effusion which will be treated with prednisone, rest, and follow-up with primary doctor if not improving.  Final Clinical Impression(s) / ED Diagnoses Final diagnoses:  None    Rx / DC Orders ED Discharge Orders     None         Geoffery Lyons, MD 03/07/22 0150

## 2022-05-29 ENCOUNTER — Other Ambulatory Visit: Payer: Self-pay

## 2022-05-29 ENCOUNTER — Emergency Department (HOSPITAL_COMMUNITY): Payer: Managed Care, Other (non HMO)

## 2022-05-29 ENCOUNTER — Encounter (HOSPITAL_COMMUNITY): Payer: Self-pay | Admitting: *Deleted

## 2022-05-29 ENCOUNTER — Emergency Department (HOSPITAL_COMMUNITY)
Admission: EM | Admit: 2022-05-29 | Discharge: 2022-05-29 | Disposition: A | Discharge status: Home / Self Care | Payer: Managed Care, Other (non HMO) | Attending: Student | Admitting: Student

## 2022-05-29 DIAGNOSIS — M25562 Pain in left knee: Secondary | ICD-10-CM

## 2022-05-29 DIAGNOSIS — I10 Essential (primary) hypertension: Secondary | ICD-10-CM | POA: Diagnosis not present

## 2022-05-29 MED ORDER — NAPROXEN 375 MG PO TABS: 375.0000 mg | ORAL_TABLET | Freq: Two times a day (BID) | ORAL | 0 refills | Status: DC

## 2022-05-29 MED ORDER — KETOROLAC TROMETHAMINE 15 MG/ML IJ SOLN
15.0000 mg | Freq: Once | INTRAMUSCULAR | Status: AC
  Administered 2022-05-29: 15 mg via INTRAMUSCULAR
  Filled 2022-05-29: qty 1

## 2022-05-29 NOTE — ED Triage Notes (Signed)
Pt c/o left knee pain and swelling for over a month. Pt reports he was seen here during the time and given a prescription for a steroid which he reports helped, but the pain has come back.

## 2022-05-29 NOTE — ED Provider Notes (Signed)
[facility] EMERGENCY DEPARTMENT Provider Note  CSN: [id number] Arrival date & time: 05/29/22 1950  Chief Complaint(s) Knee Pain  HPI Michael Strong is a 41 y.o. male who presents emergency department for evaluation of left knee pain.  Patient states that he was previously seen in the emergency department in early July where he received a steroid shot and felt better but his knee pain has returned and is worse with walking.  He is able to bear weight and range the knee but it is limited by pain.  Pain worse at the medial aspect of the knee and patient denies fever, chest pain, shortness of breath, abdominal pain, nausea, vomiting or other systemic symptoms.   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 Authorizing Provider  amLODipine (NORVASC) 2.5 MG tablet Take 1 tablet (2.5 mg total) by mouth daily. 02/24/22   Donell Beers, FNP  Cholecalciferol (VITAMIN D3) 25 MCG (1000 UT) CAPS Take 2000 units daily 01/30/22   Paseda, Baird Kay, FNP  doxycycline (VIBRA-TABS) 100 MG tablet Take 100 mg by mouth 2 (two) times daily as needed. 10/30/21   [provider]  ibuprofen (ADVIL) 600 MG tablet Take 1 tablet (600 mg total) by mouth 2 (two) times daily. 02/24/22   Paseda, Baird Kay, FNP  Omega-3 Fatty Acids (FISH OIL) 1000 MG CAPS Take 1 capsule (1,000 mg total) by mouth 2 (two) times  daily. Patient not taking: Reported on 02/24/2022 01/30/22   Donell Beers, FNP  predniSONE (DELTASONE) 10 MG tablet Take 2 tablets (20 mg total) by mouth 2 (two) times daily. 03/07/22   Geoffery Lyons, MD  UNABLE TO FIND Blood pressure cuff DX: I10 01/27/22   Donell Beers, FNP                                                                                                                                    Past Surgical History History reviewed. No pertinent surgical history. Family History Family History  Problem Relation Age of Onset   Diabetes Father    Cancer - Colon Neg Hx    Cancer - Prostate Neg Hx    Lung cancer Neg Hx     Social History Social History   Tobacco Use   Smoking status: Former   Smokeless tobacco: Never  Tobacco comments:    Smoked for less than a year  Vaping Use   Vaping Use: Never used  Substance Use Topics   Alcohol use: No   Drug use: No   Allergies Patient has no known allergies.  Review of Systems Review of Systems  Musculoskeletal:  Positive for arthralgias and joint swelling.    Physical Exam Vital Signs  I have reviewed the triage vital signs BP (!) 140/93 (BP Location: Right Arm)   Pulse 73   Temp 98.2 F (36.8 C) (Oral)   Resp 14   Ht 5\' 10"  (1.778 m)   Wt 102.5 kg   SpO2 99%   BMI 32.43 kg/m   Physical Exam Vitals and nursing note reviewed.  Constitutional:      General: He is not in acute distress.    Appearance: He is well-developed.  HENT:     Head: Normocephalic and atraumatic.  Eyes:     Conjunctiva/sclera: Conjunctivae normal.  Cardiovascular:     Rate and Rhythm: Normal rate and regular rhythm.     Heart sounds: No murmur heard. Pulmonary:     Effort: Pulmonary effort is normal. No respiratory distress.     Breath sounds: Normal breath sounds.  Abdominal:     Palpations: Abdomen is soft.     Tenderness: There is no abdominal tenderness.  Musculoskeletal:        General: Swelling and tenderness  present.     Cervical back: Neck supple.  Skin:    General: Skin is warm and dry.     Capillary Refill: Capillary refill takes less than 2 seconds.  Neurological:     Mental Status: He is alert.  Psychiatric:        Mood and Affect: Mood normal.     ED Results and Treatments Labs (all labs ordered are listed, but only abnormal results are displayed) Labs Reviewed - No data to display                                                                                                                        Radiology No results found.  Pertinent labs & imaging results that were available during my care of the patient were reviewed by me and considered in my medical decision making (see MDM for details).  Medications Ordered in ED Medications  ketorolac (TORADOL) 15 MG/ML injection 15 mg (has no administration in time range)  Procedures Procedures  (including critical care time)  Medical Decision Making / ED Course   This patient presents to the ED for concern of knee pain, this involves an extensive number of treatment options, and is a complaint that carries with it a high risk of complications and morbidity.  The differential diagnosis includes arthritis, ligamentous strain, fracture, gout, septic joint  MDM: Patient seen the emergency room for evaluation of knee pain.  Physical exam with mild tenderness to the medial aspect of the left knee but is otherwise unremarkable.  Patient able to bear weight and range the knee, lowering suspicion for septic joint.  X-ray unremarkable.  Patient given single dose IM Toradol and had significant improvement in his pain.  Patient given outpatient orthopedic follow-up and a prescription for Naprosyn as well as an Ace wrap.  Patient then discharged.   Additional history obtained:  -External records from outside  source obtained and reviewed including: Chart review including previous notes, labs, imaging, consultation notes   Lab Tests: -I ordered, reviewed, and interpreted labs.   The pertinent results include:   Labs Reviewed - No data to display     Imaging Studies ordered: I ordered imaging studies including x-ray knee I independently visualized and interpreted imaging. I agree with the radiologist interpretation   Medicines ordered and prescription drug management: Meds ordered this encounter  Medications   ketorolac (TORADOL) 15 MG/ML injection 15 mg    -I have reviewed the patients home medicines and have made adjustments as needed  Critical interventions none    Cardiac Monitoring: The patient was maintained on a cardiac monitor.  I personally viewed and interpreted the cardiac monitored which showed an underlying rhythm of: NSR  Social Determinants of Health:  Factors impacting patients care include: none   Reevaluation: After the interventions noted above, I reevaluated the patient and found that they have :improved  Co morbidities that complicate the patient evaluation  Past Medical History:  Diagnosis Date   Hypertension       Dispostion: I considered admission for this patient, but he currently does not meet inpatient criteria for admission and is safe for discharge with outpatient follow-up     Final Clinical Impression(s) / ED Diagnoses Final diagnoses:  None     @PCDICTATION @    Scotlynn Noyes, , MD 05/29/22 1457

## 2022-07-08 ENCOUNTER — Encounter (HOSPITAL_COMMUNITY): Payer: Self-pay

## 2022-07-08 ENCOUNTER — Emergency Department (HOSPITAL_COMMUNITY)
Admission: EM | Admit: 2022-07-08 | Discharge: 2022-07-08 | Disposition: A | Discharge status: Home / Self Care | Payer: Managed Care, Other (non HMO) | Attending: Emergency Medicine | Admitting: Emergency Medicine

## 2022-07-08 ENCOUNTER — Other Ambulatory Visit: Payer: Self-pay

## 2022-07-08 ENCOUNTER — Emergency Department (HOSPITAL_COMMUNITY): Payer: Managed Care, Other (non HMO)

## 2022-07-08 DIAGNOSIS — I10 Essential (primary) hypertension: Secondary | ICD-10-CM | POA: Diagnosis not present

## 2022-07-08 DIAGNOSIS — M25532 Pain in left wrist: Secondary | ICD-10-CM | POA: Diagnosis present

## 2022-07-08 DIAGNOSIS — Z87891 Personal history of nicotine dependence: Secondary | ICD-10-CM | POA: Diagnosis not present

## 2022-07-08 MED ORDER — NAPROXEN 250 MG PO TABS
500.0000 mg | ORAL_TABLET | Freq: Once | ORAL | Status: AC
  Administered 2022-07-08: 500 mg via ORAL
  Filled 2022-07-08: qty 2

## 2022-07-08 MED ORDER — NAPROXEN 500 MG PO TABS: 500.0000 mg | ORAL_TABLET | Freq: Two times a day (BID) | ORAL | 0 refills | Status: DC

## 2022-07-08 MED ORDER — CEPHALEXIN 500 MG PO CAPS: 500.0000 mg | ORAL_CAPSULE | Freq: Four times a day (QID) | ORAL | 0 refills | Status: AC

## 2022-07-08 NOTE — Discharge Instructions (Signed)
You were evaluated in the Emergency Department and after careful evaluation, we did not find any emergent condition requiring admission or further testing in the hospital.  Your exam/testing today is overall reassuring.  X-ray was normal.  Suspect your pain is due to bruising or sprain, but infection of the skin is also considered.  Take the Keflex antibiotic as directed, use the Naprosyn anti-inflammatory for pain.  Please return to the Emergency Department if you experience any worsening of your condition.   Thank you for allowing [place] to be a part of your care.

## 2022-07-08 NOTE — ED Triage Notes (Signed)
Pt was working on car yesterday and hit left wrist on something - now has redness, swelling and pain.

## 2022-07-08 NOTE — ED Provider Notes (Signed)
[facility] Emergency Department Provider Note MRN:  [id number]  Arrival date & time: 07/08/22     Chief Complaint   Wrist Pain   History of Present Illness   Michael Strong is a 41 y.o. year-old male with a history of hypertension presenting to the ED with chief complaint of wrist pain.  Was working on his car and thinks he pulled on something too hard, having a lot of left wrist and hand pain.  Also hit his wrist on something and has a cup.  No fever, no other complaints.  Review of Systems  A thorough review of systems was obtained and all systems are negative except as noted in the HPI and PMH.   Patient's Health History    Past Medical History:  Diagnosis Date   Hypertension     History reviewed. No pertinent surgical history.  Family History  Problem Relation Age of Onset   Diabetes Father    Cancer - Colon Neg Hx    Cancer - Prostate Neg Hx    Lung cancer Neg Hx     Social History   Socioeconomic History   Marital status: Single    Spouse name: Not on file   Number of children: 6   Years of education: 12   Highest education level: Not on file  Occupational History    Employer: [employer]  Tobacco Use   Smoking status: Former   Smokeless tobacco: Never   Tobacco comments:    Smoked for less than a year  Scientific laboratory technician Use: Never used  Substance and Sexual Activity   Alcohol use: No   Drug use: No   Sexual activity: Yes  Other Topics Concern   Not on file  Social History Narrative   Lives with his girlfriend and children. Works at [employer].    Social Determinants of Health   Financial Resource Strain: Not on file  Food Insecurity: Not on file  Transportation Needs: Not on file  Physical Activity: Not on file  Stress: Not on file  Social Connections: Not on file  Intimate Partner Violence: Not on file     Physical Exam   Vitals:   07/08/22 0537 07/08/22 0630  BP:  (!) 143/103  Pulse:  73  Resp:     Temp: 98.1 F (36.7 C)   SpO2:  98%    CONSTITUTIONAL: Well-appearing, NAD NEURO/PSYCH:  Alert and oriented x 3, no focal deficits EYES:  eyes equal and reactive ENT/NECK:  no LAD, no JVD CARDIO: Regular rate, well-perfused, normal S1 and S2 PULM:  CTAB no wheezing or rhonchi GI/GU:  non-distended, non-tender MSK/SPINE:  No gross deformities, no edema SKIN: Superficial abrasion to the radial aspect of left wrist   *Additional and/or pertinent findings included in MDM below  Diagnostic and Interventional Summary    EKG Interpretation  Date/Time:    Ventricular Rate:    PR Interval:    QRS Duration:   QT Interval:    QTC Calculation:   R Axis:     Text Interpretation:         Labs Reviewed - No data to display  DG Wrist Complete Left  Final Result      Medications  naproxen (NAPROSYN) tablet 500 mg (500 mg Oral Given 07/08/22 4259)     Procedures  /  Critical Care Procedures  ED Course and Medical Decision Making  Initial Impression and Ddx DDx includes sprain, fracture, tendinitis, retained  foreign body, cellulitis  Past medical/surgical history that increases complexity of ED encounter: None  Interpretation of Diagnostics I personally reviewed the wrist x-ray and my interpretation is as follows: No obvious bony abnormality or foreign body    Patient Reassessment and Ultimate Disposition/Management     Patient does have some mild erythema near the abrasion on the wrist, will cover for cellulitis with Keflex.  Patient management required discussion with the following services or consulting groups:  None  Complexity of Problems Addressed Acute complicated illness or Injury  Additional Data Reviewed and Analyzed Further history obtained from: None  Additional Factors Impacting ED Encounter Risk Prescriptions  Barth Kirks. Sedonia Small, MD [place] [email or web address]  Final Clinical Impressions(s) / ED  Diagnoses     ICD-10-CM   1. Left wrist pain  M25.532       ED Discharge Orders          Ordered    cephALEXin (KEFLEX) 500 MG capsule  4 times daily        07/08/22 0658    naproxen (NAPROSYN) 500 MG tablet  2 times daily        07/08/22 [id number]             Discharge Instructions Discussed with and Provided to Patient:     Discharge Instructions      You were evaluated in the Emergency Department and after careful evaluation, we did not find any emergent condition requiring admission or further testing in the hospital.  Your exam/testing today is overall reassuring.  X-ray was normal.  Suspect your pain is due to bruising or sprain, but infection of the skin is also considered.  Take the Keflex antibiotic as directed, use the Naprosyn anti-inflammatory for pain.  Please return to the Emergency Department if you experience any worsening of your condition.   Thank you for allowing [place] to be a part of your care.       Maudie Flakes, MD 07/08/22 [phone]

## 2022-08-10 ENCOUNTER — Encounter (HOSPITAL_COMMUNITY): Payer: Self-pay | Admitting: Emergency Medicine

## 2022-08-10 ENCOUNTER — Emergency Department (HOSPITAL_COMMUNITY)
Admission: EM | Admit: 2022-08-10 | Discharge: 2022-08-10 | Disposition: A | Discharge status: Home / Self Care | Payer: Managed Care, Other (non HMO) | Attending: Emergency Medicine | Admitting: Emergency Medicine

## 2022-08-10 DIAGNOSIS — R300 Dysuria: Secondary | ICD-10-CM | POA: Diagnosis present

## 2022-08-10 DIAGNOSIS — R309 Painful micturition, unspecified: Secondary | ICD-10-CM | POA: Diagnosis not present

## 2022-08-10 LAB — URINALYSIS, ROUTINE W REFLEX MICROSCOPIC
Bacteria, UA: NONE SEEN
Bilirubin Urine: NEGATIVE
Glucose, UA: NEGATIVE mg/dL
Ketones, ur: NEGATIVE mg/dL
Leukocytes,Ua: NEGATIVE
Nitrite: NEGATIVE
Protein, ur: NEGATIVE mg/dL
Specific Gravity, Urine: 1.021 (ref 1.005–1.030)
pH: 6 (ref 5.0–8.0)

## 2022-08-10 MED ORDER — CEPHALEXIN 500 MG PO CAPS: 500.0000 mg | ORAL_CAPSULE | Freq: Three times a day (TID) | ORAL | 0 refills | Status: DC

## 2022-08-10 MED ORDER — CEPHALEXIN 500 MG PO CAPS
500.0000 mg | ORAL_CAPSULE | Freq: Once | ORAL | Status: AC
Start: 2022-08-10 — End: 2022-08-10
  Administered 2022-08-10: 500 mg via ORAL
  Filled 2022-08-10: qty 1

## 2022-08-10 NOTE — ED Triage Notes (Signed)
Pt concerned for UTI due to burning with urination since yesterday.

## 2022-08-10 NOTE — ED Provider Notes (Signed)
[facility] EMERGENCY DEPARTMENT Provider Note   CSN: [id number] Arrival date & time: 08/10/22  0315     History  Chief Complaint  Patient presents with   Urinary Tract Infection    Michael Strong is a 41 y.o. male.  Patient is a 41 year old male presenting with complaints of dysuria.  He describes a 24-hour history of burning with urination.  He denies any discharge.  He denies any fevers, chills, or back pain.  Patient is sexually active, but denies new partners and does not feel as though he is at risk for an STD.  There are no aggravating or alleviating factors.  The history is provided by the patient.       Home Medications Prior to Admission medications   Medication Sig Start Date End Date Taking? Authorizing Provider  amLODipine (NORVASC) 2.5 MG tablet Take 1 tablet (2.5 mg total) by mouth daily. 02/24/22   Renee Rival, FNP  Cholecalciferol (VITAMIN D3) 25 MCG (1000 UT) CAPS Take 2000 units daily 01/30/22   Paseda, Dewaine Conger, FNP  doxycycline (VIBRA-TABS) 100 MG tablet Take 100 mg by mouth 2 (two) times daily as needed. 10/30/21   [provider]  naproxen (NAPROSYN) 500 MG tablet Take 1 tablet (500 mg total) by mouth 2 (two) times daily. 07/08/22   Maudie Flakes, MD  Omega-3 Fatty Acids (FISH OIL) 1000 MG CAPS Take 1 capsule (1,000 mg total) by mouth 2 (two) times daily. Patient not taking: Reported on 02/24/2022 01/30/22   Renee Rival, FNP  predniSONE (DELTASONE) 10 MG tablet Take 2 tablets (20 mg total) by mouth 2 (two) times daily. 03/07/22   Veryl Speak, MD  UNABLE TO FIND Blood pressure cuff DX: I10 01/27/22   Paseda, Dewaine Conger, FNP      Allergies    Patient has no known allergies.    Review of Systems   Review of Systems  All other systems reviewed and are negative.   Physical Exam Updated Vital Signs BP (!) 140/103 (BP Location: Right Arm)   Pulse 76   Temp 98 F (36.7 C) (Oral)   Resp 18   Ht 5\' 10"  (1.778 m)   Wt 97.5 kg    SpO2 98%   BMI 30.85 kg/m  Physical Exam Vitals and nursing note reviewed.  Constitutional:      Appearance: Normal appearance.  Pulmonary:     Effort: Pulmonary effort is normal.  Abdominal:     Tenderness: There is no abdominal tenderness.  Skin:    General: Skin is warm and dry.  Neurological:     Mental Status: He is alert and oriented to person, place, and time.     ED Results / Procedures / Treatments   Labs (all labs ordered are listed, but only abnormal results are displayed) Labs Reviewed  URINALYSIS, ROUTINE W REFLEX MICROSCOPIC - Abnormal; Notable for the following components:      Result Value   Hgb urine dipstick SMALL (*)    All other components within normal limits  GC/CHLAMYDIA PROBE AMP ([place]) NOT AT [facility]    EKG None  Radiology No results found.  Procedures Procedures    Medications Ordered in ED Medications  cephALEXin (KEFLEX) capsule 500 mg (has no administration in time range)    ED Course/ Medical Decision Making/ A&P  Patient presenting with burning on urination.  He reports a history of prior UTI many years ago and this feels similar.  His urinalysis is not  highly suggestive of this, but will be treated with an antibiotic.  GC and Chlamydia test are pending.  Patient does not feel as though he is at high risk for STD, so will await results of this.  Final Clinical Impression(s) / ED Diagnoses Final diagnoses:  None    Rx / DC Orders ED Discharge Orders     None         Geoffery Lyons, MD 08/10/22 [phone]

## 2022-08-10 NOTE — Discharge Instructions (Signed)
Begin taking Keflex as prescribed.  We will call you if your cultures indicate you require further treatment or need to take additional action.  Follow-up with primary doctor if not improving in the next week.

## 2022-08-11 ENCOUNTER — Telehealth: Payer: Self-pay

## 2022-08-11 LAB — GC/CHLAMYDIA PROBE AMP (~~LOC~~) NOT AT ARMC
Chlamydia: NEGATIVE
Comment: NEGATIVE
Comment: NORMAL
Neisseria Gonorrhea: NEGATIVE

## 2022-08-11 NOTE — Telephone Encounter (Signed)
Transition Care Management Unsuccessful Follow-up Telephone Call  Date of discharge and from where:  08/10/22  Attempts:  1st Attempt  Reason for unsuccessful TCM follow-up call:  Left voice message.   Renelda Loma RMA

## 2022-08-18 ENCOUNTER — Telehealth: Payer: Self-pay

## 2022-08-18 NOTE — Telephone Encounter (Signed)
Transition Care Management Unsuccessful Follow-up Telephone Call  Date of discharge and from where:  08/10/22  Attempts:  2nd Attempt  Reason for unsuccessful TCM follow-up call:  Left voice message  Renelda Loma RMA

## 2022-08-30 ENCOUNTER — Encounter: Payer: Managed Care, Other (non HMO) | Admitting: Nurse Practitioner

## 2022-09-06 ENCOUNTER — Encounter: Payer: Managed Care, Other (non HMO) | Admitting: Family Medicine

## 2022-09-14 ENCOUNTER — Encounter: Payer: Self-pay | Admitting: Family Medicine

## 2022-09-14 ENCOUNTER — Ambulatory Visit (INDEPENDENT_AMBULATORY_CARE_PROVIDER_SITE_OTHER): Payer: Managed Care, Other (non HMO) | Admitting: Family Medicine

## 2022-09-14 VITALS — BP 138/94 | HR 93 | Ht 70.0 in | Wt 229.0 lb

## 2022-09-14 DIAGNOSIS — Z0001 Encounter for general adult medical examination with abnormal findings: Secondary | ICD-10-CM | POA: Diagnosis not present

## 2022-09-14 DIAGNOSIS — E039 Hypothyroidism, unspecified: Secondary | ICD-10-CM | POA: Diagnosis not present

## 2022-09-14 DIAGNOSIS — R7301 Impaired fasting glucose: Secondary | ICD-10-CM

## 2022-09-14 DIAGNOSIS — I10 Essential (primary) hypertension: Secondary | ICD-10-CM | POA: Diagnosis not present

## 2022-09-14 DIAGNOSIS — E785 Hyperlipidemia, unspecified: Secondary | ICD-10-CM | POA: Diagnosis not present

## 2022-09-14 MED ORDER — AMLODIPINE BESYLATE 5 MG PO TABS: 5.0000 mg | ORAL_TABLET | Freq: Every day | ORAL | 2 refills | Status: DC

## 2022-09-14 NOTE — Progress Notes (Signed)
Complete physical exam  Patient: Michael Strong   DOB: [birth year]   42 y.o. Male  MRN: [id number]  Subjective:    Chief Complaint  Patient presents with   Annual Exam    Michael Strong is a 42 y.o. male who presents today for a complete physical exam. He reports consuming a general diet. Walks daily He generally feels well. He reports sleeping well. He does not have additional problems to discuss today.    Most recent fall risk assessment:    09/14/2022    4:05 PM  [place] in the past year? 0  Number falls in past yr: 0  Injury with Fall? 0  Follow up Falls evaluation completed     Most recent depression screenings:    09/14/2022    4:05 PM 02/24/2022   10:50 AM  PHQ 2/9 Scores  PHQ - 2 Score 0 0    Vision:Within last year and 5 years  Patient Care Team: Del Ria Comment, Lamar Benes, FNP as PCP - General (Family Medicine)   Outpatient Medications Prior to Visit  Medication Sig   cephALEXin (KEFLEX) 500 MG capsule Take 1 capsule (500 mg total) by mouth 3 (three) times daily.   Cholecalciferol (VITAMIN D3) 25 MCG (1000 UT) CAPS Take 2000 units daily   doxycycline (VIBRA-TABS) 100 MG tablet Take 100 mg by mouth 2 (two) times daily as needed.   naproxen (NAPROSYN) 500 MG tablet Take 1 tablet (500 mg total) by mouth 2 (two) times daily.   Omega-3 Fatty Acids (FISH OIL) 1000 MG CAPS Take 1 capsule (1,000 mg total) by mouth 2 (two) times daily.   predniSONE (DELTASONE) 10 MG tablet Take 2 tablets (20 mg total) by mouth 2 (two) times daily.   UNABLE TO FIND Blood pressure cuff DX: I10   [DISCONTINUED] amLODipine (NORVASC) 2.5 MG tablet Take 1 tablet (2.5 mg total) by mouth daily.   No facility-administered medications prior to visit.    Review of Systems  Constitutional:  Negative for chills and fever.  HENT:  Negative for hearing loss and tinnitus.   Eyes:  Negative for blurred vision.  Respiratory:  Negative for shortness of breath.   Cardiovascular:   Negative for chest pain.  Gastrointestinal:  Negative for abdominal pain, heartburn, nausea and vomiting.  Genitourinary:  Negative for dysuria.  Skin:  Negative for rash.  Neurological:  Negative for dizziness and headaches.  Psychiatric/Behavioral:  Negative for depression. The patient is not nervous/anxious.        Objective:    BP (!) 138/94   Pulse 93   Ht 5\' 10"  (1.778 m)   Wt 229 lb (103.9 kg)   SpO2 96%   BMI 32.86 kg/m  BP Readings from Last 3 Encounters:  09/14/22 (!) 138/94  08/10/22 (!) 139/109  07/08/22 (!) 143/96      Physical Exam Vitals reviewed.  HENT:     Head: Normocephalic.     Right Ear: Tympanic membrane normal.     Left Ear: Tympanic membrane normal.     Nose: Nose normal. No congestion or rhinorrhea.     Mouth/Throat:     Mouth: Mucous membranes are moist.  Eyes:     Extraocular Movements: Extraocular movements intact.     Pupils: Pupils are equal, round, and reactive to light.  Cardiovascular:     Rate and Rhythm: Normal rate and regular rhythm.     Pulses: Normal pulses.     Heart sounds:  Normal heart sounds.  Pulmonary:     Effort: Pulmonary effort is normal.     Breath sounds: Normal breath sounds.  Abdominal:     General: Bowel sounds are normal. There is no distension.     Palpations: Abdomen is soft. There is no mass.  Musculoskeletal:        General: No swelling. Normal range of motion.     Cervical back: Normal range of motion.  Skin:    General: Skin is warm.     Capillary Refill: Capillary refill takes less than 2 seconds.  Neurological:     General: No focal deficit present.     Mental Status: He is alert.  Psychiatric:        Mood and Affect: Mood normal.      No results found for any visits on 09/14/22.    Assessment & Plan:    Routine Health Maintenance and Physical Exam  Immunization History  Administered Date(s) Administered   Tdap 01/27/2022    Health Maintenance  Topic Date Due   DTaP/Tdap/Td (2 - Td  or Tdap) 01/28/2032   Hepatitis C Screening  Completed   HIV Screening  Completed   HPV VACCINES  Aged Out   INFLUENZA VACCINE  Discontinued   COVID-19 Vaccine  Discontinued    Discussed health benefits of physical activity, and encouraged him to engage in regular exercise appropriate for his age and condition.  Primary hypertension -     Microalbumin / creatinine urine ratio -     CBC with Differential/Platelet -     amLODIPine Besylate; Take 1 tablet (5 mg total) by mouth daily.  Dispense: 30 tablet; Refill: 2  Hypothyroidism, unspecified type -     TSH + free T4  Hyperlipidemia, unspecified hyperlipidemia type -     Lipid panel -     CMP14+EGFR  IFG (impaired fasting glucose) -     Hemoglobin A1c  Encounter for annual general medical examination with abnormal findings in adult Assessment & Plan: Annual Physical Done, Labs ordered  Patient B/P Slightly elevated and not controlled increased amlodipine 2.5 mg to 5 mg Educated patient on diet changes following a Dash Diet ,stress reduction and exercise maintenance.  BMI 32.86 classified obese        No follow-ups on file.     Renard Hamper Ria Comment, FNP

## 2022-09-14 NOTE — Assessment & Plan Note (Addendum)
Annual Physical Done, Labs ordered  Patient B/P Slightly elevated and not controlled increased amlodipine 2.5 mg to 5 mg Educated patient on diet changes following a Dash Diet ,stress reduction and exercise maintenance.  BMI 32.86 classified obese

## 2022-09-14 NOTE — Patient Instructions (Signed)
Please Follow up in the next 4 months for HTN Management

## 2022-09-16 LAB — LIPID PANEL
Chol/HDL Ratio: 5.6 ratio — ABNORMAL HIGH (ref 0.0–5.0)
Cholesterol, Total: 175 mg/dL (ref 100–199)
HDL: 31 mg/dL — ABNORMAL LOW (ref >39)
LDL Chol Calc (NIH): 79 mg/dL (ref 0–99)
Triglycerides: 402 mg/dL — ABNORMAL HIGH (ref 0–149)
VLDL Cholesterol Cal: 65 mg/dL — ABNORMAL HIGH (ref 5–40)

## 2022-09-16 LAB — CBC WITH DIFFERENTIAL/PLATELET
Basophils Absolute: 0 10*3/uL (ref 0.0–0.2)
Basos: 1 %
EOS (ABSOLUTE): 0.1 10*3/uL (ref 0.0–0.4)
Eos: 1 %
Hematocrit: 45.4 % (ref 37.5–51.0)
Hemoglobin: 15.8 g/dL (ref 13.0–17.7)
Immature Grans (Abs): 0 10*3/uL (ref 0.0–0.1)
Immature Granulocytes: 0 %
Lymphocytes Absolute: 2.1 10*3/uL (ref 0.7–3.1)
Lymphs: 34 %
MCH: 32.1 pg (ref 26.6–33.0)
MCHC: 34.8 g/dL (ref 31.5–35.7)
MCV: 92 fL (ref 79–97)
Monocytes Absolute: 0.6 10*3/uL (ref 0.1–0.9)
Monocytes: 9 %
Neutrophils Absolute: 3.4 10*3/uL (ref 1.4–7.0)
Neutrophils: 55 %
Platelets: 249 10*3/uL (ref 150–450)
RBC: 4.92 x10E6/uL (ref 4.14–5.80)
RDW: 13.9 % (ref 11.6–15.4)
WBC: 6.2 10*3/uL (ref 3.4–10.8)

## 2022-09-16 LAB — MICROALBUMIN / CREATININE URINE RATIO
Creatinine, Urine: 176.6 mg/dL
Microalb/Creat Ratio: 7 mg/g creat (ref 0–29)
Microalbumin, Urine: 12.4 ug/mL

## 2022-09-16 LAB — HEMOGLOBIN A1C
Est. average glucose Bld gHb Est-mCnc: 123 mg/dL
Hgb A1c MFr Bld: 5.9 % — ABNORMAL HIGH (ref 4.8–5.6)

## 2022-09-16 LAB — CMP14+EGFR
ALT: 27 IU/L (ref 0–44)
AST: 20 IU/L (ref 0–40)
Albumin/Globulin Ratio: 1.9 (ref 1.2–2.2)
Albumin: 4.8 g/dL (ref 4.1–5.1)
Alkaline Phosphatase: 95 IU/L (ref 44–121)
BUN/Creatinine Ratio: 13 (ref 9–20)
BUN: 14 mg/dL (ref 6–24)
Bilirubin Total: 0.3 mg/dL (ref 0.0–1.2)
CO2: 23 mmol/L (ref 20–29)
Calcium: 9.6 mg/dL (ref 8.7–10.2)
Chloride: 100 mmol/L (ref 96–106)
Creatinine, Ser: 1.1 mg/dL (ref 0.76–1.27)
Globulin, Total: 2.5 g/dL (ref 1.5–4.5)
Glucose: 106 mg/dL — ABNORMAL HIGH (ref 70–99)
Potassium: 4.4 mmol/L (ref 3.5–5.2)
Sodium: 138 mmol/L (ref 134–144)
Total Protein: 7.3 g/dL (ref 6.0–8.5)
eGFR: 86 mL/min/{1.73_m2} (ref >59)

## 2022-09-16 LAB — TSH+FREE T4
Free T4: 1.17 ng/dL (ref 0.82–1.77)
TSH: 1.91 u[IU]/mL (ref 0.450–4.500)

## 2022-09-28 ENCOUNTER — Encounter: Payer: Managed Care, Other (non HMO) | Admitting: Nurse Practitioner

## 2023-01-18 ENCOUNTER — Encounter: Payer: Managed Care, Other (non HMO) | Admitting: Family Medicine

## 2023-01-18 NOTE — Patient Instructions (Signed)

## 2023-01-18 NOTE — Progress Notes (Signed)
No show appointment  

## 2023-01-21 ENCOUNTER — Other Ambulatory Visit: Payer: Self-pay

## 2023-01-21 ENCOUNTER — Encounter (HOSPITAL_COMMUNITY): Payer: Self-pay

## 2023-01-21 ENCOUNTER — Emergency Department (HOSPITAL_COMMUNITY): Payer: Managed Care, Other (non HMO)

## 2023-01-21 ENCOUNTER — Emergency Department (HOSPITAL_COMMUNITY)
Admission: EM | Admit: 2023-01-21 | Discharge: 2023-01-21 | Disposition: A | Discharge status: Home / Self Care | Payer: Managed Care, Other (non HMO) | Attending: Emergency Medicine | Admitting: Emergency Medicine

## 2023-01-21 DIAGNOSIS — M25562 Pain in left knee: Secondary | ICD-10-CM | POA: Insufficient documentation

## 2023-01-21 NOTE — ED Triage Notes (Signed)
Pt reports left knee pain and swelling since Thursday after running and playing with his son.

## 2023-01-21 NOTE — Discharge Instructions (Addendum)
Recommend rest, ice, compression, elevation.  I have attached some rehab exercises to help with knee sprain, I suspect it is your MCL ligament, but you may have some mild LCL involvement as well.  Please use Tylenol or ibuprofen for pain.  You may use 600 mg ibuprofen every 6 hours or 1000 mg of Tylenol every 6 hours.  You may choose to alternate between the 2.  This would be most effective.  Not to exceed 4 g of Tylenol within 24 hours.  Not to exceed 3200 mg ibuprofen 24 hours.   If you continue to have significant pain, swelling after 1 to 2 weeks of treatment then you can follow-up with an orthopedic physician.

## 2023-01-21 NOTE — ED Notes (Signed)
Knee brace applied to left knee, no problems noted.

## 2023-01-21 NOTE — ED Provider Notes (Signed)
[place] EMERGENCY DEPARTMENT AT [facility] Provider Note   CSN: [id number] Arrival date & time: 01/21/23  1412     History  Chief Complaint  Patient presents with   Knee Pain    Michael Strong is a 42 y.o. male with noncontributory past medical history presents with concern for left knee pain, swelling since Thursday after running and playing with his son.  Patient denies any twisting injury, fall, he denies any numbness, tingling.  He has not been doing anything for the pain.  He reports it hurts to bend.   Knee Pain      Home Medications Prior to Admission medications   Medication Sig Start Date End Date Taking? Authorizing Provider  amLODipine (NORVASC) 5 MG tablet Take 1 tablet (5 mg total) by mouth daily. 09/14/22   Del Nigel Berthold, FNP  cephALEXin (KEFLEX) 500 MG capsule Take 1 capsule (500 mg total) by mouth 3 (three) times daily. 08/10/22   Geoffery Lyons, MD  Cholecalciferol (VITAMIN D3) 25 MCG (1000 UT) CAPS Take 2000 units daily 01/30/22   Paseda, Baird Kay, FNP  doxycycline (VIBRA-TABS) 100 MG tablet Take 100 mg by mouth 2 (two) times daily as needed. 10/30/21   [provider]  naproxen (NAPROSYN) 500 MG tablet Take 1 tablet (500 mg total) by mouth 2 (two) times daily. 07/08/22   Sabas Sous, MD  Omega-3 Fatty Acids (FISH OIL) 1000 MG CAPS Take 1 capsule (1,000 mg total) by mouth 2 (two) times daily. 01/30/22   Paseda, Baird Kay, FNP  predniSONE (DELTASONE) 10 MG tablet Take 2 tablets (20 mg total) by mouth 2 (two) times daily. 03/07/22   Geoffery Lyons, MD  UNABLE TO FIND Blood pressure cuff DX: I10 01/27/22   Paseda, Baird Kay, FNP      Allergies    Patient has no known allergies.    Review of Systems   Review of Systems  All other systems reviewed and are negative.   Physical Exam Updated Vital Signs BP (!) 158/107   Pulse 85   Temp 98.7 F (37.1 C) (Oral)   Resp 20   Ht 5\' 10"  (1.778 m)   Wt 99.8 kg   SpO2 98%    BMI 31.57 kg/m  Physical Exam Vitals and nursing note reviewed.  Constitutional:      General: He is not in acute distress.    Appearance: Normal appearance.  HENT:     Head: Normocephalic and atraumatic.  Eyes:     General:        Right eye: No discharge.        Left eye: No discharge.  Cardiovascular:     Rate and Rhythm: Normal rate and regular rhythm.     Pulses: Normal pulses.  Pulmonary:     Effort: Pulmonary effort is normal. No respiratory distress.  Musculoskeletal:        General: No deformity.     Comments: Patient with some tenderness in the medial and lateral compartments of the left knee.  More tender in the medial compartment.  He has no significant varus, valgus laxity, he has no significant anterior, posterior drawer laxity.  No significant crepitus with McMurray.  No effusion, negative balloon/ballottement.  Skin:    General: Skin is warm and dry.  Neurological:     Mental Status: He is alert and oriented to person, place, and time.  Psychiatric:        Mood and Affect: Mood normal.  Behavior: Behavior normal.     ED Results / Procedures / Treatments   Labs (all labs ordered are listed, but only abnormal results are displayed) Labs Reviewed - No data to display  EKG None  Radiology DG Knee Complete 4 Views Left  Result Date: 01/21/2023 CLINICAL DATA:  Knee pain and swelling three days. EXAM: LEFT KNEE - COMPLETE 4+ VIEW COMPARISON:  None Available. FINDINGS: No evidence of fracture, dislocation, or joint effusion. No evidence of arthropathy or other focal bone abnormality. Soft tissues are unremarkable. IMPRESSION: Negative left knee radiographs. Electronically Signed   By: Marin Roberts M.D.   On: 01/21/2023 15:01    Procedures Procedures    Medications Ordered in ED Medications - No data to display  ED Course/ Medical Decision Making/ A&P                             Medical Decision Making Amount and/or Complexity of Data  Reviewed Radiology: ordered.   This patient is a 42 y.o. male who presents to the ED for concern of acute left-sided knee pain.   Differential diagnoses prior to evaluation: Fracture, dislocation, ligamentous injury, sprain, strain, less clinical concern for gout, pseudogout, versus other  Past Medical History / Social History / Additional history: Chart reviewed. Pertinent results include: Noncontributory  Physical Exam: Physical exam performed. The pertinent findings include: Patient did arrive somewhat hypertensive, pressure 158/107, likely secondary to pain, but encourage close PCP follow-up if it remains this elevated.  Patient with some tenderness in the medial and lateral compartments of the left knee.  More tender in the medial compartment.  He has no significant varus, valgus laxity, he has no significant anterior, posterior drawer laxity.  No significant crepitus with McMurray.  No effusion, negative balloon/ballottement.  Normal 2+ DP, PT pulses on the left.  Intact capillary refill distal to the injury.  I independently interpreted plain film imaging of the left knee which shows no evidence of acute fracture, dislocation, significant joint effusion.  I agree with radiologist interpretation.  Medications / Treatment: Patient with signs and symptoms of knee sprain, likely of the MCL ligament, encouraged RICE, ibuprofen, Tylenol, orthopedic follow-up as needed.  Patient understands and agrees to plan, discharged in stable condition at this time.   Disposition: After consideration of the diagnostic results and the patients response to treatment, I feel that patient is stable for discharge at this time.   emergency department workup does not suggest an emergent condition requiring admission or immediate intervention beyond what has been performed at this time. The plan is: as above. The patient is safe for discharge and has been instructed to return immediately for worsening symptoms,  change in symptoms or any other concerns.  Final Clinical Impression(s) / ED Diagnoses Final diagnoses:  Acute pain of left knee    Rx / DC Orders ED Discharge Orders     None         [place] 01/21/23 1505    Terrilee Files, MD 01/21/23 1732

## 2023-01-23 ENCOUNTER — Telehealth: Payer: Self-pay

## 2023-01-23 NOTE — Transitions of Care (Post Inpatient/ED Visit) (Signed)
   01/23/2023  Name: Michael Strong MRN: [id number] DOB: 1980/11/12  Today's TOC FU Call Status: Today's TOC FU Call Status:: Unsuccessul Call (1st Attempt) Unsuccessful Call (1st Attempt) Date: 01/23/23  Attempted to reach the patient regarding the most recent Inpatient/ED visit.  Follow Up Plan: Additional outreach attempts will be made to reach the patient to complete the Transitions of Care (Post Inpatient/ED visit) call.   Signature Agnes Lawrence, CMA (AAMA)  [facility] [phone]

## 2023-01-31 ENCOUNTER — Ambulatory Visit: Payer: Managed Care, Other (non HMO) | Admitting: Family Medicine

## 2023-01-31 ENCOUNTER — Encounter: Payer: Self-pay | Admitting: Family Medicine

## 2023-01-31 ENCOUNTER — Ambulatory Visit (HOSPITAL_COMMUNITY)
Admission: RE | Admit: 2023-01-31 | Discharge: 2023-01-31 | Disposition: A | Discharge status: Home / Self Care | Payer: Managed Care, Other (non HMO) | Source: Ambulatory Visit | Attending: Family Medicine | Admitting: Family Medicine

## 2023-01-31 VITALS — BP 152/105 | HR 73 | Ht 70.0 in | Wt 230.0 lb

## 2023-01-31 DIAGNOSIS — I10 Essential (primary) hypertension: Secondary | ICD-10-CM | POA: Diagnosis not present

## 2023-01-31 DIAGNOSIS — M25562 Pain in left knee: Secondary | ICD-10-CM

## 2023-01-31 DIAGNOSIS — E559 Vitamin D deficiency, unspecified: Secondary | ICD-10-CM | POA: Diagnosis not present

## 2023-01-31 DIAGNOSIS — Z131 Encounter for screening for diabetes mellitus: Secondary | ICD-10-CM

## 2023-01-31 DIAGNOSIS — Z13220 Encounter for screening for lipoid disorders: Secondary | ICD-10-CM

## 2023-01-31 MED ORDER — AMLODIPINE BESYLATE 10 MG PO TABS
10.0000 mg | ORAL_TABLET | Freq: Every day | ORAL | 3 refills | Status: DC
Start: 2023-01-31 — End: 2023-04-11

## 2023-01-31 MED ORDER — MELOXICAM 7.5 MG PO TABS
7.5000 mg | ORAL_TABLET | Freq: Every day | ORAL | 0 refills | Status: DC
Start: 2023-01-31 — End: 2023-08-13

## 2023-01-31 NOTE — Patient Instructions (Signed)

## 2023-01-31 NOTE — Progress Notes (Signed)
Patient Office Visit   Subjective   Patient ID: Michael Strong, male    DOB: 1981/06/16  Age: 42 y.o. MRN: [id number]  CC:  Chief Complaint  Patient presents with   Follow-up    Follow up L knee pain and swelling , spot on back    HPI ISAH CATBAGAN 42 year old male presents to the clinic for Left knee pain and HTN He  has a past medical history of Hypertension.  Knee Pain  There was no injury mechanism. The pain is present in the left knee. The quality of the pain is described as aching. The pain is at a severity of 9/10. The pain has been constant since onset about 2 weeks ago. Associated symptoms include muscle weakness and tingling. Pertinent negatives include no inability to bear weight, loss of motion, loss of sensation or numbness. He reports no foreign bodies present. The symptoms are aggravated by movement and weight bearing. He has tried NSAIDs for the symptoms. The treatment provided moderate relief.        Outpatient Encounter Medications as of 01/31/2023  Medication Sig   amLODipine (NORVASC) 10 MG tablet Take 1 tablet (10 mg total) by mouth daily.   Cholecalciferol (VITAMIN D3) 25 MCG (1000 UT) CAPS Take 2000 units daily   meloxicam (MOBIC) 7.5 MG tablet Take 1 tablet (7.5 mg total) by mouth daily.   UNABLE TO FIND Blood pressure cuff DX: I10   [DISCONTINUED] amLODipine (NORVASC) 5 MG tablet Take 1 tablet (5 mg total) by mouth daily.   [DISCONTINUED] naproxen (NAPROSYN) 500 MG tablet Take 1 tablet (500 mg total) by mouth 2 (two) times daily.   Omega-3 Fatty Acids (FISH OIL) 1000 MG CAPS Take 1 capsule (1,000 mg total) by mouth 2 (two) times daily. (Patient not taking: Reported on 01/31/2023)   [DISCONTINUED] cephALEXin (KEFLEX) 500 MG capsule Take 1 capsule (500 mg total) by mouth 3 (three) times daily.   [DISCONTINUED] doxycycline (VIBRA-TABS) 100 MG tablet Take 100 mg by mouth 2 (two) times daily as needed.   [DISCONTINUED] predniSONE (DELTASONE) 10 MG tablet  Take 2 tablets (20 mg total) by mouth 2 (two) times daily.   No facility-administered encounter medications on file as of 01/31/2023.    History reviewed. No pertinent surgical history.  Review of Systems  Constitutional:  Negative for chills and fever.  Eyes:  Negative for blurred vision.  Respiratory:  Negative for shortness of breath.   Cardiovascular:  Negative for chest pain.  Gastrointestinal:  Negative for abdominal pain.  Genitourinary:  Negative for dysuria.  Musculoskeletal:  Positive for joint pain and myalgias. Negative for falls.  Neurological:  Negative for dizziness and headaches.      Objective    BP (!) 152/105 (BP Location: Left Arm, Patient Position: Sitting, Cuff Size: Large)   Pulse 73   Ht 5\' 10"  (1.778 m)   Wt 230 lb (104.3 kg)   SpO2 95%   BMI 33.00 kg/m   Physical Exam Vitals reviewed.  Constitutional:      General: He is not in acute distress.    Appearance: Normal appearance. He is not ill-appearing, toxic-appearing or diaphoretic.  HENT:     Head: Normocephalic.  Eyes:     General:        Right eye: No discharge.        Left eye: No discharge.     Conjunctiva/sclera: Conjunctivae normal.  Cardiovascular:     Rate and Rhythm: Normal rate.  Pulses: Normal pulses.     Heart sounds: Normal heart sounds.  Pulmonary:     Effort: Pulmonary effort is normal. No respiratory distress.     Breath sounds: Normal breath sounds.  Abdominal:     General: Bowel sounds are normal.     Palpations: Abdomen is soft.     Tenderness: There is no abdominal tenderness. There is no guarding.  Musculoskeletal:        General: Normal range of motion.     Cervical back: Normal range of motion.     Right knee: Normal. No swelling or erythema. Normal range of motion. No tenderness.     Left knee: Normal. No swelling or erythema. Normal range of motion. No tenderness.  Skin:    General: Skin is warm and dry.     Capillary Refill: Capillary refill takes less  than 2 seconds.  Neurological:     General: No focal deficit present.     Mental Status: He is alert and oriented to person, place, and time.     Coordination: Coordination normal.     Gait: Gait normal.  Psychiatric:        Mood and Affect: Mood normal.        Behavior: Behavior normal.       Assessment & Plan:  Acute pain of left knee Assessment & Plan: Xray ordered- awaiting results will follow up Mobic 7.5 mg PRN Explained to patient Non pharmacological interventions include the use of ice or heat, rest, recommend range of motion exercises, gentle stretching. The use of NSAIDs for pain management.  Follow up for worsening or persistent symptoms. Patient verbalizes understanding regarding plan of care and all questions answered.    Orders: -     DG Knee 1-2 Views Left; Future -     Meloxicam; Take 1 tablet (7.5 mg total) by mouth daily.  Dispense: 30 tablet; Refill: 0  Primary hypertension Assessment & Plan: Vitals:   01/31/23 1035 01/31/23 1036  BP: (!) 158/101 (!) 152/105  Blood pressure not controlled in today's visit Increased Amlodipine to 10 mg daily Follow up in 4 weeks Continued discussion on DASH diet, low sodium diet and maintain a exercise routine for 150 minutes per week.   Orders: -     amLODIPine Besylate; Take 1 tablet (10 mg total) by mouth daily.  Dispense: 30 tablet; Refill: 3 -     Microalbumin / creatinine urine ratio -     CMP14+EGFR -     CBC with Differential/Platelet  Vitamin D deficiency -     VITAMIN D 25 Hydroxy (Vit-D Deficiency, Fractures)  Screening for lipid disorders -     Lipid panel  Screening for diabetes mellitus -     Hemoglobin A1c    Return in about 4 weeks (around 02/28/2023) for hypertension, re-check blood pressure.   Cruzita Lederer Newman Nip, FNP

## 2023-01-31 NOTE — Assessment & Plan Note (Signed)
Vitals:   01/31/23 1035 01/31/23 1036  BP: (!) 158/101 (!) 152/105  Blood pressure not controlled in today's visit Increased Amlodipine to 10 mg daily Follow up in 4 weeks Continued discussion on DASH diet, low sodium diet and maintain a exercise routine for 150 minutes per week.

## 2023-01-31 NOTE — Assessment & Plan Note (Signed)
Xray ordered- awaiting results will follow up Mobic 7.5 mg PRN Explained to patient Non pharmacological interventions include the use of ice or heat, rest, recommend range of motion exercises, gentle stretching. The use of NSAIDs for pain management.  Follow up for worsening or persistent symptoms. Patient verbalizes understanding regarding plan of care and all questions answered.

## 2023-02-01 LAB — CBC WITH DIFFERENTIAL/PLATELET
Basos: 0 %
EOS (ABSOLUTE): 0.1 10*3/uL (ref 0.0–0.4)
Lymphocytes Absolute: 1.9 10*3/uL (ref 0.7–3.1)
Monocytes Absolute: 0.4 10*3/uL (ref 0.1–0.9)
Neutrophils Absolute: 2.8 10*3/uL (ref 1.4–7.0)
Neutrophils: 54 %
RDW: 13.8 % (ref 11.6–15.4)
WBC: 5.2 10*3/uL (ref 3.4–10.8)

## 2023-02-01 LAB — CMP14+EGFR
Albumin/Globulin Ratio: 1.9 (ref 1.2–2.2)
Albumin: 4.7 g/dL (ref 4.1–5.1)
Alkaline Phosphatase: 97 IU/L (ref 44–121)
Bilirubin Total: 0.3 mg/dL (ref 0.0–1.2)
CO2: 24 mmol/L (ref 20–29)
Chloride: 101 mmol/L (ref 96–106)
Potassium: 4.5 mmol/L (ref 3.5–5.2)

## 2023-02-01 LAB — LIPID PANEL: Chol/HDL Ratio: 4.2 ratio (ref 0.0–5.0)

## 2023-02-01 LAB — MICROALBUMIN / CREATININE URINE RATIO

## 2023-02-01 NOTE — Transitions of Care (Post Inpatient/ED Visit) (Signed)
   02/01/2023  Name: Michael Strong MRN: [id number] DOB: [birth year]  Today's TOC FU Call Status: Today's TOC FU Call Status:: Unsuccessul Call (1st Attempt) Unsuccessful Call (1st Attempt) Date: 01/23/23  Attempted to reach the patient regarding the most recent Inpatient/ED visit.  Follow Up Plan: No further outreach attempts will be made at this time. We have been unable to contact the patient. Patient already seen by PCP. Signature Agnes Lawrence, CMA (AAMA)  [facility] [phone]

## 2023-02-03 LAB — CMP14+EGFR
ALT: 32 IU/L (ref 0–44)
AST: 26 IU/L (ref 0–40)
BUN/Creatinine Ratio: 12 (ref 9–20)
BUN: 14 mg/dL (ref 6–24)
Calcium: 9.9 mg/dL (ref 8.7–10.2)
Creatinine, Ser: 1.18 mg/dL (ref 0.76–1.27)
Globulin, Total: 2.5 g/dL (ref 1.5–4.5)
Glucose: 98 mg/dL (ref 70–99)
Sodium: 141 mmol/L (ref 134–144)
Total Protein: 7.2 g/dL (ref 6.0–8.5)
eGFR: 80 mL/min/{1.73_m2} (ref >59)

## 2023-02-03 LAB — LIPID PANEL
Cholesterol, Total: 174 mg/dL (ref 100–199)
HDL: 41 mg/dL (ref >39)
LDL Chol Calc (NIH): 108 mg/dL — ABNORMAL HIGH (ref 0–99)
Triglycerides: 141 mg/dL (ref 0–149)
VLDL Cholesterol Cal: 25 mg/dL (ref 5–40)

## 2023-02-03 LAB — CBC WITH DIFFERENTIAL/PLATELET
Basophils Absolute: 0 10*3/uL (ref 0.0–0.2)
Eos: 2 %
Hematocrit: 42.2 % (ref 37.5–51.0)
Hemoglobin: 14.9 g/dL (ref 13.0–17.7)
Immature Grans (Abs): 0 10*3/uL (ref 0.0–0.1)
Immature Granulocytes: 0 %
Lymphs: 36 %
MCH: 31.8 pg (ref 26.6–33.0)
MCHC: 35.3 g/dL (ref 31.5–35.7)
MCV: 90 fL (ref 79–97)
Monocytes: 8 %
Platelets: 209 10*3/uL (ref 150–450)
RBC: 4.69 x10E6/uL (ref 4.14–5.80)

## 2023-02-03 LAB — HEMOGLOBIN A1C
Est. average glucose Bld gHb Est-mCnc: 123 mg/dL
Hgb A1c MFr Bld: 5.9 % — ABNORMAL HIGH (ref 4.8–5.6)

## 2023-02-03 LAB — MICROALBUMIN / CREATININE URINE RATIO
Creatinine, Urine: 70.6 mg/dL
Microalbumin, Urine: 4.9 ug/mL

## 2023-02-03 LAB — VITAMIN D 25 HYDROXY (VIT D DEFICIENCY, FRACTURES): Vit D, 25-Hydroxy: 16.3 ng/mL — ABNORMAL LOW (ref 30.0–100.0)

## 2023-02-28 ENCOUNTER — Encounter: Payer: Self-pay | Admitting: Family Medicine

## 2023-02-28 ENCOUNTER — Ambulatory Visit (INDEPENDENT_AMBULATORY_CARE_PROVIDER_SITE_OTHER): Payer: Managed Care, Other (non HMO) | Admitting: Family Medicine

## 2023-02-28 VITALS — BP 138/88 | HR 78 | Ht 70.0 in | Wt 229.1 lb

## 2023-02-28 DIAGNOSIS — I10 Essential (primary) hypertension: Secondary | ICD-10-CM

## 2023-02-28 MED ORDER — HYDROCHLOROTHIAZIDE 12.5 MG PO TABS
12.5000 mg | ORAL_TABLET | Freq: Every day | ORAL | 1 refills | Status: DC
Start: 2023-02-28 — End: 2023-04-11

## 2023-02-28 NOTE — Patient Instructions (Signed)
        Great to see you today.  I have refilled the medication(s) we provide.    - Please take medications as prescribed. - Follow up with your primary health provider if any health concerns arises. - If symptoms worsen please contact your primary care provider and/or visit the emergency department.  

## 2023-02-28 NOTE — Progress Notes (Signed)
Patient Office Visit   Subjective   Patient ID: Michael Strong, male    DOB: 11/27/1980  Age: 42 y.o. MRN: [id number]  CC:  Chief Complaint  Patient presents with   Hypertension    Follow up for blood pressure check.    Leg Swelling    Pt reports swelling on both calfs since 02/21/23.     HPI Michael Strong 42 year old male presents to the clinic for HTN follow up. He  has a past medical history of Hypertension.For the details of today's visit, please refer to assessment and plan.   Hypertension This is a recurrent problem. The problem has been gradually improving since onset. The problem is uncontrolled. Associated symptoms include malaise/fatigue and peripheral edema. Pertinent negatives include no blurred vision, chest pain, headaches or shortness of breath. Risk factors for coronary artery disease include male gender, obesity and smoking/tobacco exposure. Past treatments include calcium channel blockers. The current treatment provides mild improvement. Compliance problems include diet and exercise.       Outpatient Encounter Medications as of 02/28/2023  Medication Sig   amLODipine (NORVASC) 10 MG tablet Take 1 tablet (10 mg total) by mouth daily.   Cholecalciferol (VITAMIN D3) 25 MCG (1000 UT) CAPS Take 2000 units daily   hydrochlorothiazide (HYDRODIURIL) 12.5 MG tablet Take 1 tablet (12.5 mg total) by mouth daily.   meloxicam (MOBIC) 7.5 MG tablet Take 1 tablet (7.5 mg total) by mouth daily.   Omega-3 Fatty Acids (FISH OIL) 1000 MG CAPS Take 1 capsule (1,000 mg total) by mouth 2 (two) times daily.   UNABLE TO FIND Blood pressure cuff DX: I10   No facility-administered encounter medications on file as of 02/28/2023.    History reviewed. No pertinent surgical history.  Review of Systems  Constitutional:  Positive for malaise/fatigue. Negative for chills and fever.  Eyes:  Negative for blurred vision.  Respiratory:  Negative for shortness of breath.    Cardiovascular:  Positive for leg swelling. Negative for chest pain.  Gastrointestinal:  Negative for abdominal pain.  Neurological:  Negative for dizziness and headaches.      Objective    BP 138/88 (BP Location: Left Arm)   Pulse 78   Ht 5\' 10"  (1.778 m)   Wt 229 lb 1.9 oz (103.9 kg)   SpO2 96%   BMI 32.88 kg/m   Physical Exam Vitals reviewed.  Constitutional:      General: He is not in acute distress.    Appearance: Normal appearance. He is not ill-appearing, toxic-appearing or diaphoretic.  HENT:     Head: Normocephalic.  Eyes:     General:        Right eye: No discharge.        Left eye: No discharge.     Conjunctiva/sclera: Conjunctivae normal.  Cardiovascular:     Rate and Rhythm: Normal rate.     Pulses: Normal pulses.     Heart sounds: Normal heart sounds.  Pulmonary:     Effort: Pulmonary effort is normal. No respiratory distress.     Breath sounds: Normal breath sounds.  Abdominal:     General: Bowel sounds are normal.     Palpations: Abdomen is soft.     Tenderness: There is no abdominal tenderness. There is no guarding.  Musculoskeletal:        General: Normal range of motion.     Cervical back: Normal range of motion.  Skin:    General: Skin is warm and dry.  Capillary Refill: Capillary refill takes less than 2 seconds.  Neurological:     General: No focal deficit present.     Mental Status: He is alert and oriented to person, place, and time.     Coordination: Coordination normal.     Gait: Gait normal.  Psychiatric:        Mood and Affect: Mood normal.        Behavior: Behavior normal.       Assessment & Plan:  Primary hypertension Assessment & Plan: Vitals:   02/28/23 1056 02/28/23 1106  BP: (!) 142/93 138/88   Continue Amlodipine 10 mg daily Started Hydrochlorothiazide 12.5 mg daily Follow up in 6 weeks Continued discussion on DASH diet, low sodium diet and maintain a exercise routine for 150 minutes per week.   Orders: -      hydroCHLOROthiazide; Take 1 tablet (12.5 mg total) by mouth daily.  Dispense: 30 tablet; Refill: 1    Return in about 6 weeks (around 04/11/2023) for hypertension, re-check blood pressure.   Cruzita Lederer Newman Nip, FNP

## 2023-02-28 NOTE — Assessment & Plan Note (Signed)
Vitals:   02/28/23 1056 02/28/23 1106  BP: (!) 142/93 138/88   Continue Amlodipine 10 mg daily Started Hydrochlorothiazide 12.5 mg daily Follow up in 6 weeks Continued discussion on DASH diet, low sodium diet and maintain a exercise routine for 150 minutes per week.

## 2023-04-11 ENCOUNTER — Ambulatory Visit: Payer: Managed Care, Other (non HMO) | Admitting: Family Medicine

## 2023-04-11 ENCOUNTER — Encounter: Payer: Self-pay | Admitting: Family Medicine

## 2023-04-11 VITALS — BP 130/78 | HR 83 | Ht 70.0 in | Wt 226.0 lb

## 2023-04-11 DIAGNOSIS — I10 Essential (primary) hypertension: Secondary | ICD-10-CM | POA: Diagnosis not present

## 2023-04-11 MED ORDER — HYDROCHLOROTHIAZIDE 12.5 MG PO TABS
12.5000 mg | ORAL_TABLET | Freq: Every day | ORAL | 1 refills | Status: AC
Start: 2023-04-11 — End: ?

## 2023-04-11 MED ORDER — AMLODIPINE BESYLATE 10 MG PO TABS
10.0000 mg | ORAL_TABLET | Freq: Every day | ORAL | 1 refills | Status: DC
Start: 2023-04-11 — End: 2023-08-13

## 2023-04-11 NOTE — Progress Notes (Signed)
Patient Office Visit   Subjective   Patient ID: Michael Strong, male    DOB: May 04, 1981  Age: 42 y.o. MRN: [id number]  CC:  Chief Complaint  Patient presents with   Hypertension    Patient is here for HTN f/u. Complains of legs swelling sometimes.     Michael Strong 42 year old male, presents to the clinic for HTN follow up . He  has a past medical history of Hypertension.For the details of today's visit, please refer to assessment and plan.   Michael  Outpatient Encounter Medications as of 04/11/2023  Medication Sig   Cholecalciferol (VITAMIN D3) 25 MCG (1000 UT) CAPS Take 2000 units daily   meloxicam (MOBIC) 7.5 MG tablet Take 1 tablet (7.5 mg total) by mouth daily.   Omega-3 Fatty Acids (FISH OIL) 1000 MG CAPS Take 1 capsule (1,000 mg total) by mouth 2 (two) times daily.   UNABLE TO FIND Blood pressure cuff DX: I10   [DISCONTINUED] amLODipine (NORVASC) 10 MG tablet Take 1 tablet (10 mg total) by mouth daily.   [DISCONTINUED] hydrochlorothiazide (HYDRODIURIL) 12.5 MG tablet Take 1 tablet (12.5 mg total) by mouth daily.   amLODipine (NORVASC) 10 MG tablet Take 1 tablet (10 mg total) by mouth daily.   hydrochlorothiazide (HYDRODIURIL) 12.5 MG tablet Take 1 tablet (12.5 mg total) by mouth daily.   No facility-administered encounter medications on file as of 04/11/2023.    No past surgical history on file.  Review of Systems  Constitutional:  Negative for chills and fever.  Eyes:  Negative for blurred vision.  Respiratory:  Negative for shortness of breath.   Cardiovascular:  Negative for chest pain.  Neurological:  Negative for dizziness.      Objective    BP 130/78   Pulse 83   Ht 5\' 10"  (1.778 m)   Wt 226 lb (102.5 kg)   SpO2 94%   BMI 32.43 kg/m   Physical Exam Vitals reviewed.  Constitutional:      General: He is not in acute distress.    Appearance: Normal appearance. He is not ill-appearing, toxic-appearing or diaphoretic.  HENT:     Head:  Normocephalic.  Eyes:     General:        Right eye: No discharge.        Left eye: No discharge.     Conjunctiva/sclera: Conjunctivae normal.  Cardiovascular:     Rate and Rhythm: Normal rate.     Pulses: Normal pulses.     Heart sounds: Normal heart sounds.  Pulmonary:     Effort: Pulmonary effort is normal. No respiratory distress.     Breath sounds: Normal breath sounds.  Musculoskeletal:        General: Normal range of motion.     Cervical back: Normal range of motion.  Skin:    General: Skin is warm and dry.     Capillary Refill: Capillary refill takes less than 2 seconds.  Neurological:     General: No focal deficit present.     Mental Status: He is alert and oriented to person, place, and time.     Coordination: Coordination normal.     Gait: Gait normal.  Psychiatric:        Mood and Affect: Mood normal.        Behavior: Behavior normal.       Assessment & Plan:  Primary hypertension Assessment & Plan: Vitals:   04/11/23 1109 04/11/23 1110 04/11/23 1120  BP: Marland Kitchen)  142/87 133/74 130/78    Continue amlodipine 10 mg daily and Hydrochlorothiazide 12.5 daily Continued discussion on DASH diet, low sodium diet and maintain a exercise routine for 150 minutes per week.   Orders: -     amLODIPine Besylate; Take 1 tablet (10 mg total) by mouth daily.  Dispense: 90 tablet; Refill: 1 -     hydroCHLOROthiazide; Take 1 tablet (12.5 mg total) by mouth daily.  Dispense: 90 tablet; Refill: 1    Return in about 4 months (around 08/11/2023), or if symptoms worsen or fail to improve, for hypertension, pre-diabetes.   Cruzita Lederer Newman Nip, FNP

## 2023-04-11 NOTE — Assessment & Plan Note (Addendum)
Vitals:   04/11/23 1109 04/11/23 1110 04/11/23 1120  BP: (!) 142/87 133/74 130/78    Continue amlodipine 10 mg daily and Hydrochlorothiazide 12.5 daily Continued discussion on DASH diet, low sodium diet and maintain a exercise routine for 150 minutes per week.

## 2023-04-11 NOTE — Patient Instructions (Addendum)
        Great to see you today.    Excess weight can impair the lymphatic system, which is responsible for draining excess fluid from tissues. When the lymphatic system is overwhelmed or damaged, fluid can accumulate in the legs.   Venous Insufficiency: Mechanism: Excess weight can put additional pressure on the veins in the legs, making it harder for blood to return to the heart. This pressure can cause the veins to become weak or damaged, leading to pooling of blood in the lower extremities. Symptoms: Swelling (edema), varicose veins, aching or heaviness in the legs.      - Please take medications as prescribed. - Follow up with your primary health provider if any health concerns arises. - If symptoms worsen please contact your primary care provider and/or visit the emergency department.

## 2023-08-04 ENCOUNTER — Other Ambulatory Visit: Payer: Self-pay

## 2023-08-04 ENCOUNTER — Encounter (HOSPITAL_COMMUNITY): Payer: Self-pay | Admitting: Emergency Medicine

## 2023-08-04 ENCOUNTER — Emergency Department (HOSPITAL_COMMUNITY)
Admission: EM | Admit: 2023-08-04 | Discharge: 2023-08-04 | Disposition: A | Discharge status: Home / Self Care | Payer: Managed Care, Other (non HMO) | Attending: Emergency Medicine | Admitting: Emergency Medicine

## 2023-08-04 DIAGNOSIS — Z20822 Contact with and (suspected) exposure to covid-19: Secondary | ICD-10-CM | POA: Insufficient documentation

## 2023-08-04 DIAGNOSIS — R509 Fever, unspecified: Secondary | ICD-10-CM | POA: Diagnosis present

## 2023-08-04 DIAGNOSIS — R6889 Other general symptoms and signs: Secondary | ICD-10-CM

## 2023-08-04 DIAGNOSIS — M791 Myalgia, unspecified site: Secondary | ICD-10-CM | POA: Insufficient documentation

## 2023-08-04 LAB — RESP PANEL BY RT-PCR (RSV, FLU A&B, COVID)  RVPGX2
Influenza A by PCR: NEGATIVE
Influenza B by PCR: NEGATIVE
Resp Syncytial Virus by PCR: NEGATIVE
SARS Coronavirus 2 by RT PCR: NEGATIVE

## 2023-08-04 MED ORDER — ACETAMINOPHEN 325 MG PO TABS
650.0000 mg | ORAL_TABLET | Freq: Once | ORAL | Status: AC | PRN
  Administered 2023-08-04: 650 mg via ORAL
  Filled 2023-08-04: qty 2

## 2023-08-04 NOTE — ED Notes (Signed)
ED Provider at bedside. 

## 2023-08-04 NOTE — ED Provider Notes (Signed)
[place] EMERGENCY DEPARTMENT AT [facility] Provider Note   CSN: [id number] Arrival date & time: 08/04/23  5284     History  Chief Complaint  Patient presents with   Chills   Generalized Body Aches    Michael Strong is a 42 y.o. male.  42 year old male without significant past medical history who presents ER for not feeling well.  States he had a fever and bodyaches that started today.  His children are sick on Wednesday with something similar.  No cough no vomiting no diarrhea no other obvious symptoms.  No rashes.          Home Medications Prior to Admission medications   Medication Sig Start Date End Date Taking? Authorizing Provider  amLODipine (NORVASC) 10 MG tablet Take 1 tablet (10 mg total) by mouth daily. 04/11/23   Del Newman Nip, Tenna Child, FNP  Cholecalciferol (VITAMIN D3) 25 MCG (1000 UT) CAPS Take 2000 units daily 01/30/22   Paseda, Baird Kay, FNP  hydrochlorothiazide (HYDRODIURIL) 12.5 MG tablet Take 1 tablet (12.5 mg total) by mouth daily. 04/11/23   Del Nigel Berthold, FNP  meloxicam (MOBIC) 7.5 MG tablet Take 1 tablet (7.5 mg total) by mouth daily. 01/31/23   Del Nigel Berthold, FNP  Omega-3 Fatty Acids (FISH OIL) 1000 MG CAPS Take 1 capsule (1,000 mg total) by mouth 2 (two) times daily. 01/30/22   Donell Beers, FNP  UNABLE TO FIND Blood pressure cuff DX: I10 01/27/22   Paseda, Baird Kay, FNP      Allergies    Patient has no known allergies.    Review of Systems   Review of Systems  Physical Exam Updated Vital Signs BP (!) 151/85 (BP Location: Right Arm)   Pulse 95   Temp 100.2 F (37.9 C) (Oral)   Resp 18   Ht 5\' 10"  (1.778 m)   Wt 103.9 kg   SpO2 98%   BMI 32.86 kg/m  Physical Exam Vitals and nursing note reviewed.  Constitutional:      Appearance: He is well-developed.  HENT:     Head: Normocephalic and atraumatic.  Eyes:     Pupils: Pupils are equal, round, and reactive to light.  Cardiovascular:      Rate and Rhythm: Normal rate.  Pulmonary:     Effort: Pulmonary effort is normal. No respiratory distress.  Abdominal:     General: There is no distension.     Tenderness: There is no abdominal tenderness.  Musculoskeletal:        General: No swelling or tenderness. Normal range of motion.     Cervical back: Normal range of motion.  Skin:    General: Skin is warm and dry.  Neurological:     General: No focal deficit present.     Mental Status: He is alert.     ED Results / Procedures / Treatments   Labs (all labs ordered are listed, but only abnormal results are displayed) Labs Reviewed  RESP PANEL BY RT-PCR (RSV, FLU A&B, COVID)  [id number]    EKG None  Radiology No results found.  Procedures Procedures    Medications Ordered in ED Medications  acetaminophen (TYLENOL) tablet 650 mg (650 mg Oral Given 08/04/23 0235)    ED Course/ Medical Decision Making/ A&P                                 Medical Decision Making  Risk OTC drugs.   Overall patient appears well.  COVID is negative.  Lungs are clear, low suspicion for pneumonia, no symptoms of GI illness to suggest the need for workup there.  Does not appear to be dehydrated.  Abdomen benign and no other GI symptoms to suggest appendicitis, diverticulitis or other infectious process in his abdomen.  I discussed that with him that it was a bit too early in the course of his fever and feeling bad to make a definitive diagnosis.  Will employ symptomatic care at home.  Final Clinical Impression(s) / ED Diagnoses Final diagnoses:  Flu-like symptoms  Fever, unspecified fever cause    Rx / DC Orders ED Discharge Orders     None         Kayden Amend, Barbara Cower, MD 08/04/23 [id number]

## 2023-08-04 NOTE — ED Triage Notes (Signed)
Pt here with c/o chills and body aches that started yesterday. States he is "just trying to figure out what is going on". Reports kids were sick a couple of days ago.

## 2023-08-12 NOTE — Progress Notes (Unsigned)
   Established Patient Office Visit   Subjective  Patient ID: Michael Strong, male    DOB: [birth year]  Age: 42 y.o. MRN: [id number]  No chief complaint on file.   He  has a past medical history of Hypertension.  HPI  ROS    Objective:     There were no vitals taken for this visit. {Vitals History (Optional):23777}  Physical Exam   No results found for any visits on 08/13/23.  The 10-year ASCVD risk score (Arnett DK, et al., 2019) is: 8%    Assessment & Plan:  There are no diagnoses linked to this encounter.  No follow-ups on file.   Cruzita Lederer Newman Nip, FNP

## 2023-08-12 NOTE — Patient Instructions (Signed)

## 2023-08-13 ENCOUNTER — Ambulatory Visit: Payer: Managed Care, Other (non HMO) | Admitting: Family Medicine

## 2023-08-13 ENCOUNTER — Encounter: Payer: Self-pay | Admitting: Family Medicine

## 2023-08-13 VITALS — BP 136/84 | HR 80 | Ht 70.0 in | Wt 227.1 lb

## 2023-08-13 DIAGNOSIS — G8929 Other chronic pain: Secondary | ICD-10-CM | POA: Diagnosis not present

## 2023-08-13 DIAGNOSIS — E781 Pure hyperglyceridemia: Secondary | ICD-10-CM | POA: Diagnosis not present

## 2023-08-13 DIAGNOSIS — I10 Essential (primary) hypertension: Secondary | ICD-10-CM

## 2023-08-13 DIAGNOSIS — M25562 Pain in left knee: Secondary | ICD-10-CM

## 2023-08-13 MED ORDER — FISH OIL 1000 MG PO CAPS
1000.0000 mg | ORAL_CAPSULE | Freq: Two times a day (BID) | ORAL | 1 refills | Status: AC
Start: 2023-08-13 — End: ?

## 2023-08-13 MED ORDER — MELOXICAM 15 MG PO TABS
15.0000 mg | ORAL_TABLET | Freq: Every day | ORAL | 0 refills | Status: DC
Start: 2023-08-13 — End: 2023-10-02

## 2023-08-13 MED ORDER — AMLODIPINE BESYLATE 10 MG PO TABS
10.0000 mg | ORAL_TABLET | Freq: Every day | ORAL | 3 refills | Status: DC
Start: 2023-08-13 — End: 2024-01-03

## 2023-08-13 NOTE — Assessment & Plan Note (Addendum)
Patient reports Chronic knee pain with no improvement Reviewed xray results with patient Patient would like a referral to Orthopedics Advise can take Mobic 15 mg PRN Advise patient treatment plan for knee pain includes rest, applying ice for 15-20 minutes several times a day, and gentle stretching and strengthening exercises to improve muscle support. Possible Physical therapy referral may help with proper technique. Using a knee brace or compression bandage can provide support, and elevating the leg helps reduce swelling. Low-impact exercises like swimming or cycling should be introduced gradually, and maintaining a healthy weight can prevent future pain.

## 2023-08-13 NOTE — Assessment & Plan Note (Signed)
Vitals:   08/13/23 1100  BP: 136/84  Controlled Continue amlodipine 10 mg daily and Hydrochlorothiazide 12.5 daily  Labs ordered. Discussed with  patient to monitor their blood pressure regularly and maintain a heart-healthy diet rich in fruits, vegetables, whole grains, and low-fat dairy, while reducing sodium intake to less than 2,300 mg per day. Regular physical activity, such as 30 minutes of moderate exercise most days of the week, will help lower blood pressure and improve overall cardiovascular health. Avoiding smoking, limiting alcohol consumption, and managing stress. Take  prescribed medication, & take it as directed and avoid skipping doses. Seek emergency care if your blood pressure is (over 180/100) or you experience chest pain, shortness of breath, or sudden vision changes.Patient verbalizes understanding regarding plan of care and all questions answered.

## 2023-08-16 ENCOUNTER — Encounter: Payer: Self-pay | Admitting: Orthopaedic Surgery

## 2023-08-16 ENCOUNTER — Ambulatory Visit (INDEPENDENT_AMBULATORY_CARE_PROVIDER_SITE_OTHER): Payer: Managed Care, Other (non HMO) | Admitting: Orthopaedic Surgery

## 2023-08-16 VITALS — BP 132/90 | HR 74 | Ht 70.0 in | Wt 224.0 lb

## 2023-08-16 DIAGNOSIS — G8929 Other chronic pain: Secondary | ICD-10-CM

## 2023-08-16 DIAGNOSIS — M25562 Pain in left knee: Secondary | ICD-10-CM | POA: Diagnosis not present

## 2023-08-16 NOTE — Progress Notes (Signed)
Subjective:    Patient ID: Michael Strong, male    DOB: Aug 12, 1981, 42 y.o.   MRN: [id number]  HPI He has been having pain of the left knee for most of this year.  In May he was seen by another provider and had X-rays of the knee.  They were negative.  He had more swelling and giving way and popping.  He saw another provider and had X-rays in June and they were negative.  He has tried Advil, Tylenol, rubs, heat, ice with no help.  He has more giving way now.  He is tired of hurting.  He has no trauma, no redness, no numbness.   Review of Systems  Constitutional:  Positive for activity change.  Musculoskeletal:  Positive for arthralgias, gait problem and joint swelling.  All other systems reviewed and are negative. For Review of Systems, all other systems reviewed and are negative.  The following is a summary of the past history medically, past history surgically, known current medicines, social history and family history.  This information is gathered electronically by the computer from prior information and documentation.  I review this each visit and have found including this information at this point in the chart is beneficial and informative.   Past Medical History:  Diagnosis Date   Hypertension     History reviewed. No pertinent surgical history.  Current Outpatient Medications on File Prior to Visit  Medication Sig Dispense Refill   amLODipine (NORVASC) 10 MG tablet Take 1 tablet (10 mg total) by mouth daily. 30 tablet 3   Cholecalciferol (VITAMIN D3) 25 MCG (1000 UT) CAPS Take 2000 units daily 60 capsule 3   hydrochlorothiazide (HYDRODIURIL) 12.5 MG tablet Take 1 tablet (12.5 mg total) by mouth daily. 90 tablet 1   meloxicam (MOBIC) 15 MG tablet Take 1 tablet (15 mg total) by mouth daily. 30 tablet 0   Omega-3 Fatty Acids (FISH OIL) 1000 MG CAPS Take 1 capsule (1,000 mg total) by mouth 2 (two) times daily. 60 capsule 1   UNABLE TO FIND Blood pressure cuff DX: I10 1 each 0    No current facility-administered medications on file prior to visit.    Social History   Socioeconomic History   Marital status: Single    Spouse name: Not on file   Number of children: 6   Years of education: 12   Highest education level: Not on file  Occupational History    Employer: [employer]  Tobacco Use   Smoking status: Former   Smokeless tobacco: Never   Tobacco comments:    Smoked for less than a year  Advertising account planner   Vaping status: Never Used  Substance and Sexual Activity   Alcohol use: No   Drug use: No   Sexual activity: Yes  Other Topics Concern   Not on file  Social History Narrative   Lives with his girlfriend and children. Works at [employer].    Social Drivers of Corporate investment banker Strain: Not on file  Food Insecurity: Not on file  Transportation Needs: Not on file  Physical Activity: Not on file  Stress: Not on file  Social Connections: Not on file  Intimate Partner Violence: Not on file    Family History  Problem Relation Age of Onset   Diabetes Father    Cancer - Colon Neg Hx    Cancer - Prostate Neg Hx    Lung cancer Neg Hx     BP (!)  132/90 (Cuff Size: Normal)   Pulse 74   Ht 5\' 10"  (1.778 m)   Wt 224 lb (101.6 kg)   BMI 32.14 kg/m   Body mass index is 32.14 kg/m.      Objective:   Physical Exam Vitals and nursing note reviewed. Exam conducted with a chaperone present.  Constitutional:      Appearance: He is well-developed.  HENT:     Head: Normocephalic and atraumatic.  Eyes:     Conjunctiva/sclera: Conjunctivae normal.     Pupils: Pupils are equal, round, and reactive to light.  Cardiovascular:     Rate and Rhythm: Normal rate and regular rhythm.  Pulmonary:     Effort: Pulmonary effort is normal.  Abdominal:     Palpations: Abdomen is soft.  Musculoskeletal:     Cervical back: Normal range of motion and neck supple.       Legs:  Skin:    General: Skin is warm and dry.  Neurological:      Mental Status: He is alert and oriented to person, place, and time.     Cranial Nerves: No cranial nerve deficit.     Motor: No abnormal muscle tone.     Coordination: Coordination normal.     Deep Tendon Reflexes: Reflexes are normal and symmetric. Reflexes normal.  Psychiatric:        Behavior: Behavior normal.        Thought Content: Thought content normal.        Judgment: Judgment normal.   I have independently reviewed and interpreted x-rays of this patient done at another site by another physician or qualified health professional.         Assessment & Plan:   Encounter Diagnosis  Name Primary?   Chronic pain of left knee Yes   I will get MRI of the left knee.  I am concerned about medial meniscus tear.  PROCEDURE NOTE:  The patient requests injections of the left knee , verbal consent was obtained.  The left knee was prepped appropriately after time out was performed.   Sterile technique was observed and injection of 1 cc of DepoMedrol 40 mg with several cc's of plain xylocaine. Anesthesia was provided by ethyl chloride and a 20-gauge needle was used to inject the knee area. The injection was tolerated well.  A band aid dressing was applied.  The patient was advised to apply ice later today and tomorrow to the injection sight as needed.  Return in one month.  If he can get the MRI earlier, call us.  If positive, then see Dr. Romeo Apple to arrange arthroscopy of the left knee.  Call if any problem.  Precautions discussed.  Electronically Signed Darreld Mclean, MD 12/12/202410:48 AM

## 2023-08-16 NOTE — Patient Instructions (Signed)
Central Scheduling [phone]

## 2023-08-21 ENCOUNTER — Telehealth: Payer: Self-pay | Admitting: Orthopedic Surgery

## 2023-08-21 NOTE — Telephone Encounter (Signed)
Dr. Mort Sawyers pt - spoke w/the pt's girlfriend, she stated that the hospital stated we cancelled the pt's MRI due to no autho for the MRI.  They would like a call back [phone].

## 2023-08-22 ENCOUNTER — Ambulatory Visit (HOSPITAL_COMMUNITY): Payer: Managed Care, Other (non HMO)

## 2023-08-22 NOTE — Telephone Encounter (Signed)
Sorry, he's being referred to Dr. Romeo Apple from Dr. Hilda Lias.  He's scheduled to see Dr. Romeo Apple tomorrow.  Not sure if that needs to be cancelled or not, please advise.

## 2023-08-22 NOTE — Telephone Encounter (Signed)
Looks like he needs to see Dr Romeo Apple if MRI is positive Michael Strong put in for the MRI approval, likely will need to see Dr Romeo Apple after the MRI

## 2023-08-22 NOTE — Telephone Encounter (Signed)
Dr. Sanjuan Dame pt - Appt has been cancelled, pt advised.  Once we have gotten the approval for the MRI, the pt would like a call so he can schedule it [phone].

## 2023-08-23 ENCOUNTER — Ambulatory Visit: Payer: Managed Care, Other (non HMO) | Admitting: Orthopedic Surgery

## 2023-09-12 ENCOUNTER — Ambulatory Visit: Payer: Managed Care, Other (non HMO) | Admitting: Orthopedic Surgery

## 2023-10-02 ENCOUNTER — Other Ambulatory Visit: Payer: Self-pay | Admitting: Family Medicine

## 2023-10-02 DIAGNOSIS — G8929 Other chronic pain: Secondary | ICD-10-CM

## 2023-11-11 ENCOUNTER — Other Ambulatory Visit: Payer: Self-pay | Admitting: Family Medicine

## 2023-11-11 DIAGNOSIS — G8929 Other chronic pain: Secondary | ICD-10-CM

## 2023-12-12 ENCOUNTER — Other Ambulatory Visit: Payer: Self-pay | Admitting: Family Medicine

## 2023-12-12 DIAGNOSIS — G8929 Other chronic pain: Secondary | ICD-10-CM

## 2023-12-29 ENCOUNTER — Other Ambulatory Visit: Payer: Self-pay

## 2023-12-29 ENCOUNTER — Emergency Department (HOSPITAL_COMMUNITY)
Admission: EM | Admit: 2023-12-29 | Discharge: 2023-12-30 | Disposition: A | Discharge status: Home / Self Care | Attending: Emergency Medicine | Admitting: Emergency Medicine

## 2023-12-29 ENCOUNTER — Encounter (HOSPITAL_COMMUNITY): Payer: Self-pay

## 2023-12-29 DIAGNOSIS — R600 Localized edema: Secondary | ICD-10-CM | POA: Diagnosis not present

## 2023-12-29 DIAGNOSIS — I10 Essential (primary) hypertension: Secondary | ICD-10-CM | POA: Insufficient documentation

## 2023-12-29 DIAGNOSIS — Z79899 Other long term (current) drug therapy: Secondary | ICD-10-CM | POA: Diagnosis not present

## 2023-12-29 DIAGNOSIS — M7989 Other specified soft tissue disorders: Secondary | ICD-10-CM | POA: Diagnosis present

## 2023-12-29 LAB — CBC WITH DIFFERENTIAL/PLATELET
Abs Immature Granulocytes: 0.02 10*3/uL (ref 0.00–0.07)
Basophils Absolute: 0 10*3/uL (ref 0.0–0.1)
Basophils Relative: 0 %
Eosinophils Absolute: 0.1 10*3/uL (ref 0.0–0.5)
Eosinophils Relative: 1 %
HCT: 39.1 % (ref 39.0–52.0)
Hemoglobin: 13.7 g/dL (ref 13.0–17.0)
Immature Granulocytes: 0 %
Lymphocytes Relative: 35 %
Lymphs Abs: 2.1 10*3/uL (ref 0.7–4.0)
MCH: 31.6 pg (ref 26.0–34.0)
MCHC: 35 g/dL (ref 30.0–36.0)
MCV: 90.3 fL (ref 80.0–100.0)
Monocytes Absolute: 0.7 10*3/uL (ref 0.1–1.0)
Monocytes Relative: 11 %
Neutro Abs: 3.1 10*3/uL (ref 1.7–7.7)
Neutrophils Relative %: 53 %
Platelets: 218 10*3/uL (ref 150–400)
RBC: 4.33 MIL/uL (ref 4.22–5.81)
RDW: 12.9 % (ref 11.5–15.5)
WBC: 6 10*3/uL (ref 4.0–10.5)
nRBC: 0 % (ref 0.0–0.2)

## 2023-12-29 LAB — BASIC METABOLIC PANEL WITH GFR
Anion gap: 9 (ref 5–15)
BUN: 18 mg/dL (ref 6–20)
CO2: 26 mmol/L (ref 22–32)
Calcium: 9.4 mg/dL (ref 8.9–10.3)
Chloride: 103 mmol/L (ref 98–111)
Creatinine, Ser: 1.26 mg/dL — ABNORMAL HIGH (ref 0.61–1.24)
GFR, Estimated: >60 mL/min (ref >60)
Glucose, Bld: 118 mg/dL — ABNORMAL HIGH (ref 70–99)
Potassium: 3.5 mmol/L (ref 3.5–5.1)
Sodium: 138 mmol/L (ref 135–145)

## 2023-12-29 LAB — URINALYSIS, ROUTINE W REFLEX MICROSCOPIC
Bacteria, UA: NONE SEEN
Bilirubin Urine: NEGATIVE
Glucose, UA: NEGATIVE mg/dL
Ketones, ur: NEGATIVE mg/dL
Leukocytes,Ua: NEGATIVE
Nitrite: NEGATIVE
Protein, ur: NEGATIVE mg/dL
Specific Gravity, Urine: 1.019 (ref 1.005–1.030)
pH: 7 (ref 5.0–8.0)

## 2023-12-29 NOTE — ED Provider Notes (Signed)
 [facility] EM[facility]MENT AT [facility]  Provider Note  CSN: [id number] Arrival date & time: 12/29/23 2207  History Chief Complaint  Patient presents with   Leg Swelling    Michael Strong is a 43 y.o. male with history of HTN here with 2 days of mild bilateral lower leg swelling. No other symptoms, no CP, SOB, DOE, orthopnea.    Home Medications Prior to Admission medications   Medication Sig Start Date End Date Taking? Authorizing Provider  amLODipine  (NORVASC ) 10 MG tablet Take 1 tablet (10 mg total) by mouth daily. 08/13/23   Del Orbe Polanco, Rogerio Clay, FNP  Cholecalciferol (VITAMIN D3) 25 MCG (1000 UT) CAPS Take 2000 units daily 01/30/22   Paseda, Folashade R, FNP  hydrochlorothiazide  (HYDRODIURIL ) 12.5 MG tablet Take 1 tablet (12.5 mg total) by mouth daily. 04/11/23   Del Orbe Polanco, Iliana, FNP  meloxicam  (MOBIC ) 15 MG tablet Take 1 tablet by mouth once daily 12/12/23   Del Orbe Polanco, Iliana, FNP  Omega-3 Fatty Acids (FISH OIL ) 1000 MG CAPS Take 1 capsule (1,000 mg total) by mouth 2 (two) times daily. 08/13/23   Del Amber Bail, Rogerio Clay, FNP  UNABLE TO FIND Blood pressure cuff DX: I10 01/27/22   Paseda, Folashade R, FNP     Allergies    Patient has no known allergies.   Review of Systems   Review of Systems Please see HPI for pertinent positives and negatives  Physical Exam BP (!) 133/91   Pulse 91   Temp 98.4 F (36.9 C)   Resp 18   SpO2 100%   Physical Exam Vitals and nursing note reviewed.  Constitutional:      Appearance: Normal appearance.  HENT:     Head: Normocephalic and atraumatic.     Nose: Nose normal.     Mouth/Throat:     Mouth: Mucous membranes are moist.  Eyes:     Extraocular Movements: Extraocular movements intact.     Conjunctiva/sclera: Conjunctivae normal.  Cardiovascular:     Rate and Rhythm: Normal rate.  Pulmonary:     Effort: Pulmonary effort is normal.     Breath sounds: Normal breath sounds.  Abdominal:      General: Abdomen is flat.     Palpations: Abdomen is soft.     Tenderness: There is no abdominal tenderness.  Musculoskeletal:        General: No swelling. Normal range of motion.     Cervical back: Neck supple.     Right lower leg: Edema (trace) present.     Left lower leg: Edema (trace) present.  Skin:    General: Skin is warm and dry.  Neurological:     General: No focal deficit present.     Mental Status: He is alert.  Psychiatric:        Mood and Affect: Mood normal.     ED Results / Procedures / Treatments   EKG None  Procedures Procedures  Medications Ordered in the ED Medications - No data to display  Initial Impression and Plan  Patient here with very mild BLE edema, does not extend much past the ankles. Labs done in triage show normal CBC, BMP with mildly elevated creatinine consistent with previous. UA is clear. Low concern for acute organ failure as a cause. Most likely this is side effect from norvasc . He was advised to discuss with PCP medication changes. RTED for any other concerns.   ED Course       MDM  Rules/Calculators/A&P Medical Decision Making Problems Addressed: Leg edema: acute illness or injury  Amount and/or Complexity of Data Reviewed Labs: ordered. Decision-making details documented in ED Course.  Risk Prescription drug management.     Final Clinical Impression(s) / ED Diagnoses Final diagnoses:  Leg edema    Rx / DC Orders ED Discharge Orders     None        Charmayne Cooper, MD 12/29/23 2357

## 2023-12-29 NOTE — ED Triage Notes (Signed)
 Pt is coming in for reported leg swelling, he does not take medications for leg swelling and does not have CHF atleast per his Hx. He mentions the leg swelling has been going on for 2 days, in triage the legs show some minor swelling but nothing excessive. He has had a decrease in urination the last couple days as well. He is otherwise stable at this time and pleasant in triage.

## 2023-12-31 ENCOUNTER — Ambulatory Visit: Payer: Self-pay

## 2023-12-31 NOTE — Telephone Encounter (Signed)
 Patient called, left VM to return the call to the office to speak to NT.   Copied from CRM [id number]. Topic: Clinical - Red Word Triage >> Dec 31, 2023  9:11 AM Carlatta H wrote: [facility] that prompted transfer to Nurse Triage: Patient is having significant swelling in both legs and issue with urination//symptoms since 4/24

## 2023-12-31 NOTE — Telephone Encounter (Signed)
 Please see triage note today.

## 2023-12-31 NOTE — Telephone Encounter (Signed)
 Chief Complaint: leg swelling, urination problems Symptoms: urinary straining, bilateral lower leg edema Frequency: x 5 days Pertinent Negatives: Patient's significant other denies fever, blood in urine, lower back/flank pain, redness or pain in swelling of legs, chest pain, SOB. Disposition: [] ED /[] Urgent Care (no appt availability in office) / [x] Appointment(In office/virtual)/ []  [facility]/ [] Home Care/ [] Refused Recommended Disposition /[]  Mobile Bus/ []  Follow-up with PCP Additional Notes: Significant other, Jasmine, on the phone. She states some days he hardly urinates at all and others he is urinating frequently. He has stated to her that he has intermittent pain in his lower abdomen and it feels like his forcing himself to urinate. Patient seen in ED on 12/29/23 and told this could be related to his amlodipine . Patient scheduled for HFU visit his week with PCP and would like to know if he should continue taking his amlodipine .  Copied from CRM [id number]. Topic: Clinical - Red Word Triage >> Dec 31, 2023  9:33 AM Rosaria Common wrote: Red Word that prompted transfer to Nurse Triage: Red Word that prompted transfer to Nurse Triage: Patient is having significant swelling in both legs and issue with urination//symptoms since 4/24 Reason for Disposition  Urination is difficult to start (i.e., hesitancy) or straining  [1] MILD swelling of both ankles (i.e., pedal edema) AND [2] new-onset or worsening  Answer Assessment - Initial Assessment Questions 1. SYMPTOM: "What's the main symptom you're concerned about?" (e.g., frequency, incontinence)     Urinary retention, feels like he has to force the urine out.  2. ONSET: "When did the  urinary symptoms  start?"     X 5 days.  3. PAIN: "Is there any pain?" If Yes, ask: "How bad is it?" (Scale: 1-10; mild, moderate, severe)     Not present.  4. CAUSE: "What do you think is causing the symptoms?"     She state the ED provider  said it could be related to the amlodipine  he takes.  5. OTHER SYMPTOMS: "Do you have any other symptoms?" (e.g., blood in urine, fever, flank pain, pain with urination)     Intermittent lower abdominal pain (not present at this time).  6. PREGNANCY: "Is there any chance you are pregnant?" "When was your last menstrual period?"     N/A.  Answer Assessment - Initial Assessment Questions 1. ONSET: "When did the swelling start?" (e.g., minutes, hours, days)     X 5 days.  2. LOCATION: "What part of the leg is swollen?"  "Are both legs swollen or just one leg?"     Bilateral legs.  3. SEVERITY: "How bad is the swelling?" (e.g., localized; mild, moderate, severe)   - Localized: Small area of swelling localized to one leg.   - MILD pedal edema: Swelling limited to foot and ankle, pitting edema < 1/4 inch (6 mm) deep, rest and elevation eliminate most or all swelling.   - MODERATE edema: Swelling of lower leg to knee, pitting edema > 1/4 inch (6 mm) deep, rest and elevation only partially reduce swelling.   - SEVERE edema: Swelling extends above knee, facial or hand swelling present.      Knees down, mostly in ankles and feet. Moderate.  4. REDNESS: "Does the swelling look red or infected?"     Denies.  5. PAIN: "Is the swelling painful to touch?" If Yes, ask: "How painful is it?"   (Scale 1-10; mild, moderate or severe)     Denies.  6. FEVER: "Do you have a  fever?" If Yes, ask: "What is it, how was it measured, and when did it start?"      Denies.  7. CAUSE: "What do you think is causing the leg swelling?"     ED states it could be related to amlodipine .  8. MEDICAL HISTORY: "Do you have a history of blood clots (e.g., DVT), cancer, heart failure, kidney disease, or liver failure?"     Denies.  9. 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  10. OTHER SYMPTOMS: "Do you have any other symptoms?" (e.g., chest pain, difficulty breathing)       Denies.  11. PREGNANCY: "Is there any chance you are pregnant?" "When was your last menstrual period?"       N/A.  Protocols used: Urinary Symptoms-A-AH, Leg Swelling and Edema-A-AH

## 2024-01-01 NOTE — Telephone Encounter (Signed)
 Wants to know if he can stop the amlodipine  until he comes in on 5/1 for his hosp f/u visit. Having trouble urinating and was told it could be from the amlodipine . Pls advise

## 2024-01-02 NOTE — Telephone Encounter (Signed)
 He can discontinue medication and I will see him on 5/1

## 2024-01-03 ENCOUNTER — Ambulatory Visit: Payer: Self-pay | Admitting: Family Medicine

## 2024-01-03 ENCOUNTER — Encounter: Payer: Self-pay | Admitting: Family Medicine

## 2024-01-03 VITALS — BP 130/87 | HR 79 | Ht 70.0 in | Wt 237.1 lb

## 2024-01-03 DIAGNOSIS — N401 Enlarged prostate with lower urinary tract symptoms: Secondary | ICD-10-CM

## 2024-01-03 DIAGNOSIS — R109 Unspecified abdominal pain: Secondary | ICD-10-CM | POA: Insufficient documentation

## 2024-01-03 DIAGNOSIS — R1084 Generalized abdominal pain: Secondary | ICD-10-CM

## 2024-01-03 DIAGNOSIS — R3916 Straining to void: Secondary | ICD-10-CM

## 2024-01-03 MED ORDER — OLMESARTAN MEDOXOMIL 20 MG PO TABS: 10.0000 mg | ORAL_TABLET | Freq: Every day | ORAL | 3 refills | Status: AC

## 2024-01-03 NOTE — Patient Instructions (Signed)

## 2024-01-03 NOTE — Assessment & Plan Note (Signed)
 Abdominal US  ordered awaiting results will follow up. Increase oral fluid intake. Bland diet as tolerated. Avoid fluids that have a lot sugar or caffeine, Avoid spicy or fatty food. Avoid alcohol. Can take OTC tylenol  for pain. Follow-up in unable to keep food/fluid down x 24 hours, dizziness, fevers, worsening or persistent symptoms to present to ED or contact primary care provider. Patient verbalizes understanding regarding plan of care and all questions answered.

## 2024-01-03 NOTE — Progress Notes (Signed)
 Established Patient Office Visit   Subjective  Patient ID: Michael Strong, male    DOB: 01-11-1981  Age: 43 y.o. MRN: [id number]  Chief Complaint  Patient presents with   Hospitalization Follow-up    leg swelling, urination difficulties (straining, intermittent abdominal pain) x 5 days, seen in ED on 12/29/23    He  has a past medical history of Hypertension.  Patient reports experiencing sudden onset of urination difficulties starting last week, with a delay before urination begins and a long time for the flow to start. There is no weakness or interruption in the urine flow, and the patient does not feel incomplete bladder emptying. He wakes up four times per night to urinate (nocturia) but experiences no pain, burning, or discomfort during urination, nor any pressure in the pelvic area. Associated symptoms include dysuria, but there is no hematuria, urgency, or difficulty controlling the urge to urinate, and no back pain or fever. There is no known history of urinary tract infections, prostate issues, kidney stones, or pelvic surgeries   Ptient notes a new intermittent issue with waxing and waning pain located in the periumbilical region, rated at 6/10 in severity. The pain is described as aching, cramping, and burning but does not radiate. Associated symptoms include dysuria, though there is no constipation, diarrhea, nausea, or vomiting. The pain is not aggravated by anything specific, but it is relieved by liquids. The patient has not yet tried any treatments for the symptoms.    Review of Systems  Constitutional:  Negative for chills and fever.  Respiratory:  Negative for shortness of breath.   Cardiovascular:  Negative for chest pain.  Gastrointestinal:  Positive for abdominal pain. Negative for constipation, diarrhea, nausea and vomiting.  Genitourinary:  Positive for dysuria.  Neurological:  Negative for dizziness and headaches.      Objective:     BP 130/87   Pulse 79    Ht 5\' 10"  (1.778 m)   Wt 237 lb 1.9 oz (107.6 kg)   SpO2 94%   BMI 34.02 kg/m  BP Readings from Last 3 Encounters:  01/03/24 130/87  12/29/23 (!) 133/91  08/16/23 (!) 132/90      Physical Exam Vitals reviewed.  Constitutional:      General: He is not in acute distress.    Appearance: Normal appearance. He is not ill-appearing, toxic-appearing or diaphoretic.  HENT:     Head: Normocephalic.  Eyes:     General:        Right eye: No discharge.        Left eye: No discharge.     Conjunctiva/sclera: Conjunctivae normal.  Cardiovascular:     Rate and Rhythm: Normal rate.     Pulses: Normal pulses.     Heart sounds: Normal heart sounds.  Pulmonary:     Effort: Pulmonary effort is normal. No respiratory distress.     Breath sounds: Normal breath sounds.  Abdominal:     General: Bowel sounds are normal.     Palpations: Abdomen is soft. There is no mass.     Tenderness: There is abdominal tenderness. There is no right CVA tenderness, left CVA tenderness or guarding.  Musculoskeletal:     Cervical back: Normal range of motion.  Skin:    General: Skin is warm and dry.     Capillary Refill: Capillary refill takes less than 2 seconds.  Neurological:     Mental Status: He is alert.     Coordination: Coordination normal.  Gait: Gait normal.  Psychiatric:        Mood and Affect: Mood normal.        Behavior: Behavior normal.      No results found for any visits on 01/03/24.  The 10-year ASCVD risk score (Arnett DK, et al., 2019) is: 6.1%    Assessment & Plan:  Benign prostatic hyperplasia (BPH) with straining on urination -     PSA  Urinary straining Assessment & Plan: Urinalysis, urine culture, PSA ordered-  Advise it's important to drink plenty of water   Avoid irritants like caffeine, alcohol, spicy foods, and artificial sweeteners, as they can aggravate your bladder. Wearing loose, breathable clothing, especially cotton underwear. Referral placed to urology    Orders: -     UA/M w/rflx Culture, Comp -     Ambulatory referral to Urology  Generalized abdominal pain Assessment & Plan: Abdominal US  ordered awaiting results will follow up. Increase oral fluid intake. Bland diet as tolerated. Avoid fluids that have a lot sugar or caffeine, Avoid spicy or fatty food. Avoid alcohol. Can take OTC tylenol  for pain. Follow-up in unable to keep food/fluid down x 24 hours, dizziness, fevers, worsening or persistent symptoms to present to ED or contact primary care provider. Patient verbalizes understanding regarding plan of care and all questions answered.   Orders: -     US  Abdomen Complete; Future  Other orders -     Olmesartan  Medoxomil; Take 0.5 tablets (10 mg total) by mouth daily.  Dispense: 30 tablet; Refill: 3    Return in about 4 months (around 05/05/2024), or if symptoms worsen or fail to improve, for hypertension.   Avelino Lek Amber Bail, FNP

## 2024-01-03 NOTE — Assessment & Plan Note (Signed)
 Urinalysis, urine culture, PSA ordered-  Advise it's important to drink plenty of water   Avoid irritants like caffeine, alcohol, spicy foods, and artificial sweeteners, as they can aggravate your bladder. Wearing loose, breathable clothing, especially cotton underwear. Referral placed to urology

## 2024-01-13 ENCOUNTER — Emergency Department (HOSPITAL_COMMUNITY)
Admission: EM | Admit: 2024-01-13 | Discharge: 2024-01-13 | Disposition: A | Discharge status: Home / Self Care | Attending: Emergency Medicine | Admitting: Emergency Medicine

## 2024-01-13 ENCOUNTER — Other Ambulatory Visit: Payer: Self-pay

## 2024-01-13 ENCOUNTER — Encounter (HOSPITAL_COMMUNITY): Payer: Self-pay | Admitting: *Deleted

## 2024-01-13 ENCOUNTER — Emergency Department (HOSPITAL_COMMUNITY)

## 2024-01-13 DIAGNOSIS — G8929 Other chronic pain: Secondary | ICD-10-CM

## 2024-01-13 DIAGNOSIS — I10 Essential (primary) hypertension: Secondary | ICD-10-CM | POA: Insufficient documentation

## 2024-01-13 DIAGNOSIS — Z79899 Other long term (current) drug therapy: Secondary | ICD-10-CM | POA: Insufficient documentation

## 2024-01-13 DIAGNOSIS — M79642 Pain in left hand: Secondary | ICD-10-CM | POA: Insufficient documentation

## 2024-01-13 MED ORDER — HYDROCODONE-ACETAMINOPHEN 5-325 MG PO TABS: 1.0000 | ORAL_TABLET | Freq: Four times a day (QID) | ORAL | 0 refills | Status: AC | PRN

## 2024-01-13 MED ORDER — KETOROLAC TROMETHAMINE 15 MG/ML IJ SOLN
15.0000 mg | Freq: Once | INTRAMUSCULAR | Status: AC
  Administered 2024-01-13: 15 mg via INTRAMUSCULAR
  Filled 2024-01-13: qty 1

## 2024-01-13 MED ORDER — DICLOFENAC SODIUM 1 % EX GEL: 2.0000 g | Freq: Four times a day (QID) | CUTANEOUS | 0 refills | Status: AC

## 2024-01-13 NOTE — Discharge Instructions (Addendum)
 You are seen today for hand pain and swelling.  Your x-ray showed some minor arthritis.  Your symptoms could be due to arthritis or gout.  There is no sign of infection or other acute findings today.  Follow-up closely with Dr. Phyllis Breeze, since you are established with him.  Use the topical Voltaren  gel.  There is also an over-the-counter lidocaine  gel you can get to help.  If pain is more severe you can take the hydrocodone .  Do not drive or operate machinery with this.

## 2024-01-13 NOTE — ED Provider Notes (Signed)
 [place] EMERGENCY DEPARTMENT AT [facility] Provider Note   CSN: [id number] Arrival date & time: 01/13/24  1312     History  Chief Complaint  Patient presents with   Hand Pain    Michael Strong is a 43 y.o. male.  He has PMH of hypertension.  Presents ER for left hand pain and swelling ongoing x 1 week, worse in the past 2 to 3 days.  Denies injury or trauma.  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  Not take any medication at home, denies fever or chills.  Denies any redness.  No history of gout.   Hand Pain       Home Medications Prior to Admission medications   Medication Sig Start Date End Date Taking? Authorizing Provider  diclofenac  Sodium (VOLTAREN ) 1 % GEL Apply 2 g topically 4 (four) times daily. 01/13/24  Yes Taiya Nutting A, PA-C  HYDROcodone -acetaminophen  (NORCO/VICODIN) 5-325 MG tablet Take 1 tablet by mouth every 6 (six) hours as needed. 01/13/24  Yes Noa Constante A, PA-C  Cholecalciferol (VITAMIN D3) 25 MCG (1000 UT) CAPS Take 2000 units daily 01/30/22   Paseda, Folashade R, FNP  hydrochlorothiazide  (HYDRODIURIL ) 12.5 MG tablet Take 1 tablet (12.5 mg total) by mouth daily. 04/11/23   Del Abron Abt, FNP  ibuprofen  (ADVIL ) 400 MG tablet Take 400 mg by mouth every 4 (four) hours. 11/15/23   [provider]  meloxicam  (MOBIC ) 15 MG tablet Take 1 tablet by mouth once daily 12/12/23   Del Orbe Polanco, Iliana, FNP  olmesartan  (BENICAR ) 20 MG tablet Take 0.5 tablets (10 mg total) by mouth daily. 01/03/24   Del Orbe Polanco, Iliana, FNP  Omega-3 Fatty Acids (FISH OIL ) 1000 MG CAPS Take 1 capsule (1,000 mg total) by mouth 2 (two) times daily. 08/13/23   Del Amber Bail, Rogerio Clay, FNP  UNABLE TO FIND Blood pressure cuff DX: I10 01/27/22   Paseda, Folashade R, FNP      Allergies    Amlodipine     Review of Systems   Review of Systems  Physical Exam Updated Vital Signs BP (!) 137/98 (BP  Location: Right Arm)   Pulse 71   Temp 98.2 F (36.8 C) (Oral)   Resp 16   Ht 5\' 10"  (1.778 m)   Wt 107.5 kg   SpO2 92%   BMI 34.01 kg/m  Physical Exam Vitals and nursing note reviewed.  Constitutional:      General: He is not in acute distress.    Appearance: He is well-developed.  HENT:     Head: Normocephalic and atraumatic.     Mouth/Throat:     Mouth: Mucous membranes are moist.  Eyes:     Extraocular Movements: Extraocular movements intact.     Conjunctiva/sclera: Conjunctivae normal.     Pupils: Pupils are equal, round, and reactive to light.  Cardiovascular:     Rate and Rhythm: Normal rate and regular rhythm.     Heart sounds: No murmur heard. Pulmonary:     Effort: Pulmonary effort is normal. No respiratory distress.     Breath sounds: Normal breath sounds.  Abdominal:     Palpations: Abdomen is soft.     Tenderness: There is no abdominal tenderness.  Musculoskeletal:        General: No swelling.     Cervical back: Neck supple.     Comments: Mild swelling to the dorsum of the left hand over the  1st and 2nd MCP joints.  There is no overlying erythema.  Radial pulse intact, sensation intact.  Patient can flex and extend all joints of the right hand and wrist without difficulty but has pain with flexion of the first MCP joint.  There is no tenderness or swelling of the left wrist forearm or upper arm, no pain to the wrist elbow or shoulder.  Skin:    General: Skin is warm and dry.     Capillary Refill: Capillary refill takes less than 2 seconds.  Neurological:     General: No focal deficit present.     Mental Status: He is alert and oriented to person, place, and time.  Psychiatric:        Mood and Affect: Mood normal.     ED Results / Procedures / Treatments   Labs (all labs ordered are listed, but only abnormal results are displayed) Labs Reviewed - No data to display  EKG None  Radiology DG Hand 2 View Left Result Date: 01/13/2024 CLINICAL DATA:   Pain after fall.  Swelling. EXAM: LEFT HAND - 2 VIEW COMPARISON:  Radiograph 04/04/2020 FINDINGS: There is no evidence of fracture or dislocation. Minor degenerative spurring of the thumb metacarpal phalangeal joint and third digit distal interphalangeal joint. No erosions. No focal soft tissue abnormalities. IMPRESSION: 1. No fracture or subluxation of the left hand. 2. Minor degenerative spurring of the thumb metacarpophalangeal joint and third digit distal interphalangeal joint. Electronically Signed   By: Chadwick Colonel M.D.   On: 01/13/2024 16:06    Procedures Procedures    Medications Ordered in ED Medications  ketorolac  (TORADOL ) 15 MG/ML injection 15 mg (15 mg Intramuscular Given 01/13/24 1528)    ED Course/ Medical Decision Making/ A&P                                 Medical Decision Making Differential diagnosis includes but is not limited to arthritis, fracture, sprain, tendinitis contusion, gout, cellulitis, other  ED course: Patient here with left hand pain that is not traumatic.  Does have some mild swelling and tenderness about the right first MCP.  No history of gout, no other joint pain.  He has normal range of motion in the joint and no systemic symptoms and no redness or warmth.  Do not suspect septic arthritis.  There is no trauma.  So patient could be overuse injury, sprain, gout.  Will plan plain x-rays, treat with NSAIDs. Patient is feeling better after IM Toradol .  He is already on Mobic  at home and has been taking this without relief so we will give topical diclofenac  and small quantity of hydrocodone  for pain.  He already sees orthopedics, Dr. Phyllis Breeze, and was advised to follow-up with him.  Amount and/or Complexity of Data Reviewed Radiology: ordered.  Risk Prescription drug management.           Final Clinical Impression(s) / ED Diagnoses Final diagnoses:  Left hand pain    Rx / DC Orders ED Discharge Orders          Ordered    diclofenac   Sodium (VOLTAREN ) 1 % GEL  4 times daily        01/13/24 1624    HYDROcodone -acetaminophen  (NORCO/VICODIN) 5-325 MG tablet  Every 6 hours PRN        01/13/24 1628              Sharnetta Gielow A, PA-C  01/13/24 1629    Cheyenne Cotta, MD 01/14/24 1752

## 2024-01-13 NOTE — Progress Notes (Signed)
 Discharge instructions explained. Discharged with partner.

## 2024-01-13 NOTE — ED Triage Notes (Signed)
 Pt with left hand swelling and pain x 2-3 days.  Denies any injury.

## 2024-01-29 ENCOUNTER — Ambulatory Visit (HOSPITAL_COMMUNITY): Admission: RE | Admit: 2024-01-29 | Source: Ambulatory Visit

## 2024-02-13 ENCOUNTER — Ambulatory Visit: Payer: Managed Care, Other (non HMO) | Admitting: Family Medicine

## 2024-04-25 ENCOUNTER — Encounter: Payer: Self-pay | Admitting: Radiology

## 2024-05-15 ENCOUNTER — Ambulatory Visit: Admitting: Family Medicine

## 2024-05-27 ENCOUNTER — Encounter: Payer: Self-pay | Admitting: Family Medicine

## 2024-07-04 ENCOUNTER — Ambulatory Visit: Payer: Self-pay | Admitting: Internal Medicine

## 2024-07-07 ENCOUNTER — Encounter: Payer: Self-pay | Admitting: Radiology

## 2024-07-10 ENCOUNTER — Ambulatory Visit: Admitting: Family Medicine

## 2024-07-18 ENCOUNTER — Ambulatory Visit: Admitting: Family Medicine

## 2024-10-28 ENCOUNTER — Ambulatory Visit: Admitting: Family Medicine
